# Patient Record
Sex: Female | Born: 1985
Health system: Southern US, Community
[De-identification: ages and names within clinical notes are randomized; demographics above are authoritative.]

## PROBLEM LIST (undated history)

## (undated) ENCOUNTER — Inpatient Hospital Stay (HOSPITAL_COMMUNITY): Payer: Self-pay

## (undated) DIAGNOSIS — Z302 Encounter for sterilization: Secondary | ICD-10-CM

## (undated) DIAGNOSIS — L309 Dermatitis, unspecified: Secondary | ICD-10-CM

## (undated) DIAGNOSIS — Z8659 Personal history of other mental and behavioral disorders: Secondary | ICD-10-CM

## (undated) DIAGNOSIS — Z8679 Personal history of other diseases of the circulatory system: Secondary | ICD-10-CM

## (undated) DIAGNOSIS — A6000 Herpesviral infection of urogenital system, unspecified: Secondary | ICD-10-CM

## (undated) DIAGNOSIS — F32A Depression, unspecified: Secondary | ICD-10-CM

## (undated) DIAGNOSIS — F329 Major depressive disorder, single episode, unspecified: Secondary | ICD-10-CM

## (undated) DIAGNOSIS — F319 Bipolar disorder, unspecified: Secondary | ICD-10-CM

## (undated) DIAGNOSIS — Z8619 Personal history of other infectious and parasitic diseases: Secondary | ICD-10-CM

## (undated) DIAGNOSIS — F419 Anxiety disorder, unspecified: Secondary | ICD-10-CM

## (undated) DIAGNOSIS — D649 Anemia, unspecified: Secondary | ICD-10-CM

## (undated) HISTORY — PX: MULTIPLE TOOTH EXTRACTIONS: SHX2053

## (undated) HISTORY — PX: WISDOM TOOTH EXTRACTION: SHX21

## (undated) HISTORY — PX: TOOTH EXTRACTION: SUR596

## (undated) HISTORY — PX: INDUCED ABORTION: SHX677

---
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
## 2006-02-21 ENCOUNTER — Emergency Department (HOSPITAL_COMMUNITY): Admission: EM | Admit: 2006-02-21 | Discharge: 2006-02-21 | Payer: Self-pay | Admitting: Emergency Medicine

## 2006-04-24 ENCOUNTER — Emergency Department (HOSPITAL_COMMUNITY): Admission: EM | Admit: 2006-04-24 | Discharge: 2006-04-24 | Payer: Self-pay | Admitting: Emergency Medicine

## 2008-01-03 HISTORY — PX: OTHER SURGICAL HISTORY: SHX169

## 2008-01-05 ENCOUNTER — Emergency Department (HOSPITAL_COMMUNITY): Admission: EM | Admit: 2008-01-05 | Discharge: 2008-01-06 | Payer: Self-pay | Admitting: Emergency Medicine

## 2008-01-07 ENCOUNTER — Emergency Department (HOSPITAL_COMMUNITY): Admission: EM | Admit: 2008-01-07 | Discharge: 2008-01-07 | Payer: Self-pay | Admitting: Emergency Medicine

## 2008-01-11 ENCOUNTER — Emergency Department (HOSPITAL_COMMUNITY): Admission: EM | Admit: 2008-01-11 | Discharge: 2008-01-11 | Payer: Self-pay | Admitting: *Deleted

## 2008-03-05 ENCOUNTER — Emergency Department (HOSPITAL_COMMUNITY): Admission: EM | Admit: 2008-03-05 | Discharge: 2008-03-05 | Payer: Self-pay | Admitting: Emergency Medicine

## 2008-04-17 ENCOUNTER — Emergency Department (HOSPITAL_COMMUNITY): Admission: EM | Admit: 2008-04-17 | Discharge: 2008-04-17 | Payer: Self-pay | Admitting: Emergency Medicine

## 2008-08-13 ENCOUNTER — Ambulatory Visit (HOSPITAL_COMMUNITY): Admission: RE | Admit: 2008-08-13 | Discharge: 2008-08-13 | Payer: Self-pay | Admitting: Obstetrics and Gynecology

## 2008-08-24 ENCOUNTER — Inpatient Hospital Stay (HOSPITAL_COMMUNITY): Admission: AD | Admit: 2008-08-24 | Discharge: 2008-08-24 | Payer: Self-pay | Admitting: Obstetrics and Gynecology

## 2008-09-02 ENCOUNTER — Inpatient Hospital Stay (HOSPITAL_COMMUNITY): Admission: AD | Admit: 2008-09-02 | Discharge: 2008-09-06 | Payer: Self-pay | Admitting: Obstetrics and Gynecology

## 2008-09-03 ENCOUNTER — Encounter (INDEPENDENT_AMBULATORY_CARE_PROVIDER_SITE_OTHER): Payer: Self-pay | Admitting: Obstetrics and Gynecology

## 2008-09-30 ENCOUNTER — Inpatient Hospital Stay (HOSPITAL_COMMUNITY): Admission: AD | Admit: 2008-09-30 | Discharge: 2008-09-30 | Payer: Self-pay | Admitting: Obstetrics and Gynecology

## 2009-05-31 ENCOUNTER — Ambulatory Visit: Payer: Self-pay | Admitting: Advanced Practice Midwife

## 2009-05-31 ENCOUNTER — Inpatient Hospital Stay (HOSPITAL_COMMUNITY): Admission: AD | Admit: 2009-05-31 | Discharge: 2009-05-31 | Payer: Self-pay | Admitting: Obstetrics and Gynecology

## 2009-12-16 ENCOUNTER — Emergency Department (HOSPITAL_COMMUNITY)
Admission: EM | Admit: 2009-12-16 | Discharge: 2009-12-16 | Payer: Self-pay | Source: Home / Self Care | Admitting: Emergency Medicine

## 2010-02-27 ENCOUNTER — Emergency Department (HOSPITAL_COMMUNITY)
Admission: EM | Admit: 2010-02-27 | Discharge: 2010-02-27 | Disposition: A | Discharge status: Home / Self Care | Payer: Medicaid Other | Attending: Emergency Medicine | Admitting: Emergency Medicine

## 2010-02-27 ENCOUNTER — Emergency Department (HOSPITAL_COMMUNITY)
Admission: EM | Admit: 2010-02-27 | Discharge: 2010-02-27 | Discharge status: Against Medical Advice | Payer: Medicaid Other | Attending: Emergency Medicine | Admitting: Emergency Medicine

## 2010-02-27 DIAGNOSIS — M545 Low back pain, unspecified: Secondary | ICD-10-CM | POA: Insufficient documentation

## 2010-02-27 DIAGNOSIS — M549 Dorsalgia, unspecified: Secondary | ICD-10-CM | POA: Insufficient documentation

## 2010-02-27 DIAGNOSIS — M79609 Pain in unspecified limb: Secondary | ICD-10-CM | POA: Insufficient documentation

## 2010-02-27 DIAGNOSIS — R209 Unspecified disturbances of skin sensation: Secondary | ICD-10-CM | POA: Insufficient documentation

## 2010-02-27 DIAGNOSIS — M5430 Sciatica, unspecified side: Secondary | ICD-10-CM | POA: Insufficient documentation

## 2010-02-27 LAB — URINALYSIS, ROUTINE W REFLEX MICROSCOPIC
Bilirubin Urine: NEGATIVE
Nitrite: NEGATIVE
Protein, ur: NEGATIVE mg/dL
Specific Gravity, Urine: 1.019 (ref 1.005–1.030)
Urine Glucose, Fasting: NEGATIVE mg/dL
Urobilinogen, UA: 1 mg/dL (ref 0.0–1.0)

## 2010-02-27 LAB — POCT PREGNANCY, URINE: Preg Test, Ur: NEGATIVE

## 2010-03-21 LAB — URINALYSIS, ROUTINE W REFLEX MICROSCOPIC
Bilirubin Urine: NEGATIVE
Glucose, UA: NEGATIVE mg/dL
Ketones, ur: NEGATIVE mg/dL
Nitrite: NEGATIVE
Protein, ur: NEGATIVE mg/dL
Specific Gravity, Urine: 1.02 (ref 1.005–1.030)

## 2010-03-21 LAB — POCT PREGNANCY, URINE: Preg Test, Ur: NEGATIVE

## 2010-03-21 LAB — CBC
Hemoglobin: 11.7 g/dL — ABNORMAL LOW (ref 12.0–15.0)
MCHC: 33.1 g/dL (ref 30.0–36.0)
MCV: 82.2 fL (ref 78.0–100.0)
Platelets: 315 10*3/uL (ref 150–400)
RBC: 4.3 MIL/uL (ref 3.87–5.11)
RDW: 18 % — ABNORMAL HIGH (ref 11.5–15.5)
WBC: 6.3 10*3/uL (ref 4.0–10.5)

## 2010-03-21 LAB — COMPREHENSIVE METABOLIC PANEL
ALT: 13 U/L (ref 0–35)
GFR calc Af Amer: >60 mL/min (ref >60)
Glucose, Bld: 86 mg/dL (ref 70–99)
Sodium: 136 mEq/L (ref 135–145)
Total Protein: 8.2 g/dL (ref 6.0–8.3)

## 2010-03-21 LAB — GC/CHLAMYDIA PROBE AMP, GENITAL: Chlamydia, DNA Probe: NEGATIVE

## 2010-03-21 LAB — URINE MICROSCOPIC-ADD ON

## 2010-04-07 ENCOUNTER — Inpatient Hospital Stay (INDEPENDENT_AMBULATORY_CARE_PROVIDER_SITE_OTHER)
Admission: RE | Admit: 2010-04-07 | Discharge: 2010-04-07 | Disposition: A | Discharge status: Home / Self Care | Payer: Medicaid Other | Source: Ambulatory Visit | Attending: Family Medicine | Admitting: Family Medicine

## 2010-04-07 DIAGNOSIS — L738 Other specified follicular disorders: Secondary | ICD-10-CM

## 2010-04-08 LAB — CBC
HCT: 23.7 % — ABNORMAL LOW (ref 36.0–46.0)
Hemoglobin: 8.1 g/dL — ABNORMAL LOW (ref 12.0–15.0)
MCHC: 32.6 g/dL (ref 30.0–36.0)
MCHC: 33.2 g/dL (ref 30.0–36.0)
MCV: 87.6 fL (ref 78.0–100.0)
Platelets: 401 10*3/uL — ABNORMAL HIGH (ref 150–400)
Platelets: 242 10*3/uL (ref 150–400)
RBC: 3.65 MIL/uL — ABNORMAL LOW (ref 3.87–5.11)
WBC: 15.7 10*3/uL — ABNORMAL HIGH (ref 4.0–10.5)

## 2010-04-08 LAB — COMPREHENSIVE METABOLIC PANEL
ALT: 16 U/L (ref 0–35)
Alkaline Phosphatase: 69 U/L (ref 39–117)
BUN: 10 mg/dL (ref 6–23)
CO2: 26 mEq/L (ref 19–32)
Calcium: 9.3 mg/dL (ref 8.4–10.5)
Creatinine, Ser: 0.7 mg/dL (ref 0.4–1.2)
GFR calc Af Amer: >60 mL/min (ref >60)
Glucose, Bld: 87 mg/dL (ref 70–99)
Potassium: 3.6 mEq/L (ref 3.5–5.1)
Sodium: 140 mEq/L (ref 135–145)

## 2010-04-13 LAB — URINE MICROSCOPIC-ADD ON

## 2010-04-13 LAB — URINE CULTURE: Colony Count: >=100000

## 2010-04-13 LAB — URINALYSIS, ROUTINE W REFLEX MICROSCOPIC
Bilirubin Urine: NEGATIVE
Hgb urine dipstick: NEGATIVE
Ketones, ur: NEGATIVE mg/dL
Nitrite: NEGATIVE
Protein, ur: NEGATIVE mg/dL

## 2010-04-13 LAB — GC/CHLAMYDIA PROBE AMP, GENITAL: Chlamydia, DNA Probe: NEGATIVE

## 2010-04-13 LAB — WET PREP, GENITAL

## 2010-04-18 LAB — URINALYSIS, ROUTINE W REFLEX MICROSCOPIC
Hgb urine dipstick: NEGATIVE
Ketones, ur: 15 mg/dL — AB
Nitrite: NEGATIVE
Protein, ur: NEGATIVE mg/dL
Urobilinogen, UA: 1 mg/dL (ref 0.0–1.0)

## 2010-04-18 LAB — DIFFERENTIAL
Basophils Relative: 0 % (ref 0–1)
Lymphocytes Relative: 21 % (ref 12–46)
Lymphs Abs: 1.7 10*3/uL (ref 0.7–4.0)
Monocytes Absolute: 0.6 10*3/uL (ref 0.1–1.0)
Monocytes Relative: 7 % (ref 3–12)
Neutro Abs: 6 10*3/uL (ref 1.7–7.7)
Neutrophils Relative %: 71 % (ref 43–77)

## 2010-04-18 LAB — WET PREP, GENITAL
Trich, Wet Prep: NONE SEEN
Yeast Wet Prep HPF POC: NONE SEEN

## 2010-04-18 LAB — PREGNANCY, URINE: Preg Test, Ur: POSITIVE

## 2010-04-18 LAB — CBC
Hemoglobin: 11.8 g/dL — ABNORMAL LOW (ref 12.0–15.0)
RBC: 4.2 MIL/uL (ref 3.87–5.11)
WBC: 8.4 10*3/uL (ref 4.0–10.5)

## 2010-04-18 LAB — POCT I-STAT, CHEM 8
BUN: 12 mg/dL (ref 6–23)
Calcium, Ion: 1.11 mmol/L — ABNORMAL LOW (ref 1.12–1.32)
Chloride: 104 mEq/L (ref 96–112)
Glucose, Bld: 94 mg/dL (ref 70–99)
HCT: 38 % (ref 36.0–46.0)
Potassium: 4.1 mEq/L (ref 3.5–5.1)

## 2010-04-18 LAB — URINE MICROSCOPIC-ADD ON

## 2010-04-18 LAB — ABO/RH: ABO/RH(D): O POS

## 2010-04-18 LAB — POCT PREGNANCY, URINE: Preg Test, Ur: POSITIVE

## 2010-05-15 ENCOUNTER — Emergency Department (HOSPITAL_COMMUNITY)
Admission: EM | Admit: 2010-05-15 | Discharge: 2010-05-15 | Disposition: A | Discharge status: Home / Self Care | Payer: Medicaid Other | Attending: Emergency Medicine | Admitting: Emergency Medicine

## 2010-05-15 DIAGNOSIS — T781XXA Other adverse food reactions, not elsewhere classified, initial encounter: Secondary | ICD-10-CM | POA: Insufficient documentation

## 2010-05-15 DIAGNOSIS — L272 Dermatitis due to ingested food: Secondary | ICD-10-CM | POA: Insufficient documentation

## 2010-05-15 DIAGNOSIS — L299 Pruritus, unspecified: Secondary | ICD-10-CM | POA: Insufficient documentation

## 2010-05-17 NOTE — Op Note (Signed)
Alison Gomez, Alison Gomez              ACCOUNT NO.:  [id number]   MEDICAL RECORD NO.:  [id number]          PATIENT TYPE:  INP   LOCATION:  9132                          FACILITY:  [facility]   PHYSICIAN:  Sherron Monday, MD        DATE OF BIRTH:  [birth year]   DATE OF PROCEDURE:  09/03/2008  DATE OF DISCHARGE:                               OPERATIVE REPORT   PREOPERATIVE DIAGNOSES:  Intrauterine pregnancy at term, failed vacuum,  arrest of descent, severe variables.   POSTOPERATIVE DIAGNOSIS:  Intrauterine pregnancy at term, failed vacuum,  arrest of descent, severe variables, delivered.   PROCEDURE:  Primary low-transverse cesarean section.   SURGEON:  Sherron Monday, MD   ASSISTANT:  None.   ANESTHESIA:  Epidural as well as attempted spinal placement.   FINDINGS:  A viable female infant at 12:55 a.m. with Apgars of 5 at 1  minute and 5 minutes and a weight of 10 pounds 7 ounces and a cord pH of  7.34.  Placenta was expressed intact.  Normal uterus, tubes, and ovaries  were noted.   PATHOLOGY:  Placenta.   COMPLICATIONS:  None.   ESTIMATED BLOOD LOSS:  1000 mL.   URINE OUTPUT:  125 mL clear urine at the end of the procedure.   IV FLUIDS:  3000 mL.   DISPOSITION:  Stable to PACU following the procedure.   DESCRIPTION OF PROCEDURE:  After informed consent was reviewed with the  patient including risks, benefits, and alternatives of the procedure  given attempt at vacuum extraction with minimal descent and 2 pop offs.  The patient opted to have a low-transverse cesarean section.  The infant  was at +2 station.  She was transferred to the operating room where her  level of her dose epidural was tested and found to be low.  A spinal was  attempted to be placed unsuccessfully, but again the epidural was tested  and found to be adequate.  She was then prepped and draped in the normal  sterile fashion.  A Foley catheter had previously been placed.  Pfannenstiel incision was made at the level  approximately 2  fingerbreadths above the pubic symphysis, carried through to the  underlying layer of fascia sharply.  The fascia was incised in midline.  The incision was extended laterally with Mayo scissors.  Inferior aspect  of fascial incision was grasped with Kocher clamps, elevating the rectus  muscles were dissected off both bluntly and sharply.  Attention was then  turned to the superior portion of the incision which in a similar  fashion was grasped with Kocher clamps, elevating the rectus muscles  were dissected off both bluntly and sharply.  Peritoneum was entered  bluntly.  Incision was extended superiorly and inferiorly with good  visualization of the bladder.  An Alexis skin retractor was carefully  placed and checked to make sure no bowel was entrapped.  The uterus was  inspected.  The vesicouterine peritoneum was identified, tented up with  smooth pickups, and a bladder flap was created both digitally and  sharply.  The uterus was incised in a  transverse fashion with a push  from below.  The infant was delivered with some difficulty from the  vertex presentation and the cord was clamped and cut.  Cord pH was also  sent.  Nose and mouth were suctioned.  The infant was handed off to  awaiting pediatric staff.  At the time of delivery, thick meconium was  noted.  Uterus was cleared of all clots and debris and the placenta was  expressed.  The uterine incision was closed with 2 layers the first of  which was 0 Vicryl and the second as an imbricating layer of 0 Monocryl.  The incision was found to be hemostatic.  Copious pelvic irrigation was  performed.  Subfascial planes were inspected.  The peritoneum was closed  using 2-0 Vicryl.  The fascia was closed after inspection, assured  hemostasis with 0 Vicryl in a running fashion.  Subcuticular adipose  layer was irrigated, made hemostatic with Bovie cautery.  The skin was  closed with staples.  The patient tolerated the  procedure well.  Sponge,  lap, and needle counts were correct x2 at the end of procedure.      Sherron Monday, MD  Electronically Signed     JB/MEDQ  D:  09/03/2008  T:  09/03/2008  Job:  [id number]

## 2010-05-22 ENCOUNTER — Emergency Department (HOSPITAL_COMMUNITY)
Admission: EM | Admit: 2010-05-22 | Discharge: 2010-05-23 | Disposition: A | Discharge status: Home / Self Care | Payer: Medicaid Other | Attending: Emergency Medicine | Admitting: Emergency Medicine

## 2010-05-22 DIAGNOSIS — F411 Generalized anxiety disorder: Secondary | ICD-10-CM | POA: Insufficient documentation

## 2010-05-22 DIAGNOSIS — T148XXA Other injury of unspecified body region, initial encounter: Secondary | ICD-10-CM | POA: Insufficient documentation

## 2010-05-22 DIAGNOSIS — Z3201 Encounter for pregnancy test, result positive: Secondary | ICD-10-CM | POA: Insufficient documentation

## 2010-05-22 DIAGNOSIS — X58XXXA Exposure to other specified factors, initial encounter: Secondary | ICD-10-CM | POA: Insufficient documentation

## 2010-05-22 DIAGNOSIS — M549 Dorsalgia, unspecified: Secondary | ICD-10-CM | POA: Insufficient documentation

## 2010-06-08 ENCOUNTER — Ambulatory Visit (HOSPITAL_COMMUNITY)
Admission: RE | Admit: 2010-06-08 | Discharge: 2010-06-08 | Disposition: A | Discharge status: Home / Self Care | Payer: Medicaid Other | Source: Ambulatory Visit | Attending: Obstetrics and Gynecology | Admitting: Obstetrics and Gynecology

## 2010-06-08 ENCOUNTER — Other Ambulatory Visit (HOSPITAL_COMMUNITY): Payer: Self-pay | Admitting: Obstetrics and Gynecology

## 2010-06-08 DIAGNOSIS — O3680X0 Pregnancy with inconclusive fetal viability, not applicable or unspecified: Secondary | ICD-10-CM

## 2010-06-08 DIAGNOSIS — Z3689 Encounter for other specified antenatal screening: Secondary | ICD-10-CM | POA: Insufficient documentation

## 2010-06-14 ENCOUNTER — Inpatient Hospital Stay (HOSPITAL_COMMUNITY)
Admission: AD | Admit: 2010-06-14 | Discharge: 2010-06-14 | Disposition: A | Discharge status: Home / Self Care | Payer: Medicaid Other | Source: Ambulatory Visit | Attending: Obstetrics and Gynecology | Admitting: Obstetrics and Gynecology

## 2010-06-14 DIAGNOSIS — O99891 Other specified diseases and conditions complicating pregnancy: Secondary | ICD-10-CM | POA: Insufficient documentation

## 2010-06-14 DIAGNOSIS — O210 Mild hyperemesis gravidarum: Secondary | ICD-10-CM

## 2010-06-14 DIAGNOSIS — R51 Headache: Secondary | ICD-10-CM | POA: Insufficient documentation

## 2010-06-14 DIAGNOSIS — O9989 Other specified diseases and conditions complicating pregnancy, childbirth and the puerperium: Secondary | ICD-10-CM

## 2010-06-14 DIAGNOSIS — R42 Dizziness and giddiness: Secondary | ICD-10-CM

## 2010-06-14 LAB — URINALYSIS, ROUTINE W REFLEX MICROSCOPIC
Bilirubin Urine: NEGATIVE
Glucose, UA: NEGATIVE mg/dL
Hgb urine dipstick: NEGATIVE
Protein, ur: NEGATIVE mg/dL
Specific Gravity, Urine: 1.02 (ref 1.005–1.030)

## 2010-06-30 LAB — ABO/RH: RH Type: POSITIVE

## 2010-06-30 LAB — GC/CHLAMYDIA PROBE AMP, GENITAL
Chlamydia: NEGATIVE
Gonorrhea: NEGATIVE

## 2010-06-30 LAB — RUBELLA ANTIBODY, IGM: Rubella: IMMUNE

## 2010-08-11 ENCOUNTER — Inpatient Hospital Stay (HOSPITAL_COMMUNITY)
Admission: AD | Admit: 2010-08-11 | Discharge: 2010-08-11 | Disposition: A | Discharge status: Home / Self Care | Payer: Medicaid Other | Source: Ambulatory Visit | Attending: Obstetrics and Gynecology | Admitting: Obstetrics and Gynecology

## 2010-08-11 ENCOUNTER — Encounter (HOSPITAL_COMMUNITY): Payer: Self-pay | Admitting: *Deleted

## 2010-08-11 DIAGNOSIS — R1011 Right upper quadrant pain: Secondary | ICD-10-CM | POA: Insufficient documentation

## 2010-08-11 DIAGNOSIS — R109 Unspecified abdominal pain: Secondary | ICD-10-CM

## 2010-08-11 DIAGNOSIS — O99891 Other specified diseases and conditions complicating pregnancy: Secondary | ICD-10-CM | POA: Insufficient documentation

## 2010-08-11 DIAGNOSIS — O26899 Other specified pregnancy related conditions, unspecified trimester: Secondary | ICD-10-CM

## 2010-08-11 LAB — CBC
HCT: 33.3 % — ABNORMAL LOW (ref 36.0–46.0)
Hemoglobin: 10.9 g/dL — ABNORMAL LOW (ref 12.0–15.0)
WBC: 9 10*3/uL (ref 4.0–10.5)

## 2010-08-11 LAB — DIFFERENTIAL
Lymphocytes Relative: 24 % (ref 12–46)
Lymphs Abs: 2.1 10*3/uL (ref 0.7–4.0)
Monocytes Absolute: 0.7 10*3/uL (ref 0.1–1.0)
Monocytes Relative: 8 % (ref 3–12)
Neutro Abs: 6.1 10*3/uL (ref 1.7–7.7)

## 2010-08-11 LAB — URINALYSIS, ROUTINE W REFLEX MICROSCOPIC
Glucose, UA: NEGATIVE mg/dL
Hgb urine dipstick: NEGATIVE
Leukocytes, UA: NEGATIVE
pH: 7 (ref 5.0–8.0)

## 2010-08-11 MED ORDER — HYDROXYZINE HCL 50 MG/ML IM SOLN
50.0000 mg | Freq: Once | INTRAMUSCULAR | Status: AC
  Administered 2010-08-11: 50 mg via INTRAMUSCULAR
  Filled 2010-08-11: qty 1

## 2010-08-11 MED ORDER — HYDROXYZINE PAMOATE 50 MG PO CAPS: 50.0000 mg | ORAL_CAPSULE | Freq: Three times a day (TID) | ORAL | Status: AC | PRN

## 2010-08-11 NOTE — Progress Notes (Signed)
States was at work, only had sips of water and pudding intake today.  C/o abd pain that begins in RUQ, radiates to left side, chest feels as if tightening, crying, hyperventilates with pain.  Feels better to lie flat.  Was seen twice before for similar episodes.  First time was told it was a panic attack, second time was to get an x-ray but found out then she was pregnant.

## 2010-08-11 NOTE — ED Provider Notes (Addendum)
History   Pt presents today via EMS c/o Rt upper quad pain that comes and goes and is worse with certain movements. She denies vag dc, bleeding, N&V, fever, or any other sx at this time.  Chief Complaint  Patient presents with  . Abdominal Pain    RUQ radiates to L side of umb.   HPI  OB History    Grav Para Term Preterm Abortions TAB SAB Ect Mult Living   3 1 1  1 1    1       Past Medical History  Diagnosis Date  . Panic attack   . Herpes genitalia     Past Surgical History  Procedure Date  . No past surgeries     No family history on file.  History  Substance Use Topics  . Smoking status: Never Smoker   . Smokeless tobacco: Never Used  . Alcohol Use: No    Allergies: No Known Allergies  Prescriptions prior to admission  Medication Sig Dispense Refill  . GuaiFENesin (TUSSIN PO) Take 5 mLs by mouth daily as needed.        . prenatal vitamin w/FE, FA (PRENATAL 1 + 1) 27-1 MG TABS Take 1 tablet by mouth daily.          Review of Systems  Constitutional: Negative for fever.  Gastrointestinal: Positive for abdominal pain. Negative for nausea, vomiting, diarrhea and constipation.  Genitourinary: Negative for dysuria, urgency, frequency and hematuria.  Neurological: Negative for dizziness and headaches.  Psychiatric/Behavioral: Negative for depression and suicidal ideas.   Physical Exam   Blood pressure 118/81, pulse 68, temperature 97.5 F (36.4 C), temperature source Oral, resp. rate 20, SpO2 99.00%.  Physical Exam  Constitutional: She is oriented to person, place, and time. She appears well-developed and well-nourished. No distress.  HENT:  Head: Normocephalic and atraumatic.  Eyes: EOM are normal. Pupils are equal, round, and reactive to light.  Cardiovascular: Normal rate, regular rhythm and normal heart sounds.  Exam reveals no gallop and no friction rub.   No murmur heard. Respiratory: Effort normal and breath sounds normal. No respiratory distress. She  has no wheezes. She has no rales. She exhibits no tenderness.  GI: Soft. She exhibits no distension. There is tenderness. There is no rebound and no guarding.  Genitourinary: No tenderness or bleeding around the vagina. No vaginal discharge found.       Cervix Lg/closed. Uterus is nontender to palpation.  Neurological: She is alert and oriented to person, place, and time.  Skin: Skin is warm and dry. She is not diaphoretic.  Psychiatric: She has a normal mood and affect. Her behavior is normal. Judgment and thought content normal.    MAU Course  Procedures  Discussed pt with Dr. Senaida Ores at length.  Results for orders placed during the hospital encounter of 08/11/10 (from the past 24 hour(s))  URINALYSIS, ROUTINE W REFLEX MICROSCOPIC     Status: Abnormal   Collection Time   08/11/10  4:40 PM      Component Value Range   Color, Urine YELLOW  YELLOW    Appearance HAZY (*) CLEAR    Specific Gravity, Urine 1.020  1.005 - 1.030    pH 7.0  5.0 - 8.0    Glucose, UA NEGATIVE  NEGATIVE (mg/dL)   Hgb urine dipstick NEGATIVE  NEGATIVE    Bilirubin Urine NEGATIVE  NEGATIVE    Ketones, ur 40 (*) NEGATIVE (mg/dL)   Protein, ur NEGATIVE  NEGATIVE (mg/dL)  Urobilinogen, UA 0.2  0.0 - 1.0 (mg/dL)   Nitrite NEGATIVE  NEGATIVE    Leukocytes, UA NEGATIVE  NEGATIVE   CBC     Status: Abnormal   Collection Time   08/11/10  4:46 PM      Component Value Range   WBC 9.0  4.0 - 10.5 (K/uL)   RBC 3.87  3.87 - 5.11 (MIL/uL)   Hemoglobin 10.9 (*) 12.0 - 15.0 (g/dL)   HCT 78.4 (*) 69.6 - 46.0 (%)   MCV 86.0  78.0 - 100.0 (fL)   MCH 28.2  26.0 - 34.0 (pg)   MCHC 32.7  30.0 - 36.0 (g/dL)   RDW 29.5 (*) 28.4 - 15.5 (%)   Platelets 317  150 - 400 (K/uL)  DIFFERENTIAL     Status: Normal   Collection Time   08/11/10  4:46 PM      Component Value Range   Neutrophils Relative 68  43 - 77 (%)   Neutro Abs 6.1  1.7 - 7.7 (K/uL)   Lymphocytes Relative 24  12 - 46 (%)   Lymphs Abs 2.1  0.7 - 4.0 (K/uL)    Monocytes Relative 8  3 - 12 (%)   Monocytes Absolute 0.7  0.1 - 1.0 (K/uL)   Eosinophils Relative 1  0 - 5 (%)   Eosinophils Absolute 0.1  0.0 - 0.7 (K/uL)   Basophils Relative 0  0 - 1 (%)   Basophils Absolute 0.0  0.0 - 0.1 (K/uL)     Assessment and Plan  Pt most likely experiencing muscle strain and anxiety. She has f/u scheduled. Discussed diet, activity, risks, and precautions.  Clinton Gallant. Macayla Ekdahl III, DrHSc, MPAS, PA-C  08/11/2010, 4:22 PM   Henrietta Hoover, PA 08/11/10 1754

## 2010-12-23 ENCOUNTER — Encounter (HOSPITAL_COMMUNITY): Payer: Self-pay | Admitting: Pharmacist

## 2011-01-05 ENCOUNTER — Encounter (HOSPITAL_COMMUNITY): Payer: Self-pay | Admitting: *Deleted

## 2011-01-06 ENCOUNTER — Inpatient Hospital Stay (HOSPITAL_COMMUNITY)
Admission: AD | Admit: 2011-01-06 | Discharge: 2011-01-06 | Disposition: A | Discharge status: Home / Self Care | Payer: Medicaid Other | Source: Ambulatory Visit | Attending: Obstetrics and Gynecology | Admitting: Obstetrics and Gynecology

## 2011-01-06 ENCOUNTER — Encounter (HOSPITAL_COMMUNITY): Payer: Self-pay

## 2011-01-06 ENCOUNTER — Encounter (HOSPITAL_COMMUNITY): Payer: Self-pay | Admitting: *Deleted

## 2011-01-06 ENCOUNTER — Encounter (HOSPITAL_COMMUNITY)
Admission: RE | Admit: 2011-01-06 | Discharge: 2011-01-06 | Disposition: A | Discharge status: Home / Self Care | Payer: Medicaid Other | Source: Ambulatory Visit | Attending: Obstetrics and Gynecology | Admitting: Obstetrics and Gynecology

## 2011-01-06 DIAGNOSIS — O99891 Other specified diseases and conditions complicating pregnancy: Secondary | ICD-10-CM | POA: Insufficient documentation

## 2011-01-06 DIAGNOSIS — A084 Viral intestinal infection, unspecified: Secondary | ICD-10-CM

## 2011-01-06 DIAGNOSIS — R109 Unspecified abdominal pain: Secondary | ICD-10-CM | POA: Insufficient documentation

## 2011-01-06 DIAGNOSIS — A088 Other specified intestinal infections: Secondary | ICD-10-CM

## 2011-01-06 DIAGNOSIS — O212 Late vomiting of pregnancy: Secondary | ICD-10-CM | POA: Insufficient documentation

## 2011-01-06 LAB — RPR: RPR Ser Ql: NONREACTIVE

## 2011-01-06 LAB — CBC
HCT: 30 % — ABNORMAL LOW (ref 36.0–46.0)
MCH: 25.7 pg — ABNORMAL LOW (ref 26.0–34.0)
MCV: 81.3 fL (ref 78.0–100.0)
Platelets: 303 10*3/uL (ref 150–400)
RBC: 3.69 MIL/uL — ABNORMAL LOW (ref 3.87–5.11)

## 2011-01-06 NOTE — Progress Notes (Signed)
Started last night, got really bad last night, lots of pressure, feels like it is opening up .

## 2011-01-06 NOTE — Patient Instructions (Addendum)
YOUR PROCEDURE IS SCHEDULED ON:01/10/11  ENTER THROUGH THE MAIN ENTRANCE OF [facility] AT:0745 am  USE DESK PHONE AND DIAL [phone] TO INFORM us OF YOUR ARRIVAL  CALL [phone] IF YOU HAVE ANY QUESTIONS OR PROBLEMS PRIOR TO YOUR ARRIVAL.  REMEMBER: DO NOT EAT OR DRINK AFTER MIDNIGHT :Monday  SPECIAL INSTRUCTIONS:   YOU MAY BRUSH YOUR TEETH THE MORNING OF SURGERY   TAKE THESE MEDICINES THE DAY OF SURGERY WITH SIP OF WATER:none   DO NOT WEAR JEWELRY, EYE MAKEUP, LIPSTICK OR DARK FINGERNAIL POLISH DO NOT WEAR LOTIONS  DO NOT SHAVE FOR 48 HOURS PRIOR TO SURGERY  YOU WILL NOT BE ALLOWED TO DRIVE YOURSELF HOME.  NAME OF DRIVER:Earl- [phone]

## 2011-01-06 NOTE — Progress Notes (Signed)
Onset of diarrhea and vomiting since last night, abdominal pain, patient was in preop for scheduled C/S on Tuesday sent over to MAU for evaluation 39 weeks, G2P1

## 2011-01-06 NOTE — ED Notes (Signed)
Retained p.o. Fluids and crackers, CNM notified

## 2011-01-06 NOTE — ED Provider Notes (Signed)
Alison Gomez y.o.G3P1011 @[redacted]w[redacted]d  Chief Complaint  Patient presents with  . Abdominal Pain  . Emesis  . Diarrhea    SUBJECTIVE  HPI: Sent to MAU from preop visit ([facility]CS next week)  due to complaints of lower abdominal pain with nausea and vomiting x 1 day. The abdominal pain is episodic and similar to that she's been having with fetal movement but her pelvic pressure is increased. She retained peaches she ate this morning. She's now hungry. She denies dysuria, urgency, frequency. No leakage of fluid or vaginal bleeding.  Past Medical History  Diagnosis Date  . Herpes genitalia   . Panic attack   . Trichomonas    Past Surgical History  Procedure Date  . Cesarean section   . Induced abortion    History   Social History  . Marital Status: Single    Spouse Name: N/A    Number of Children: N/A  . Years of Education: N/A   Occupational History  . Not on file.   Social History Main Topics  . Smoking status: Never Smoker   . Smokeless tobacco: Never Used  . Alcohol Use: Yes     not during preg  . Drug Use: No  . Sexually Active: Yes   Other Topics Concern  . Not on file   Social History Narrative  . No narrative on file   No current facility-administered medications on file prior to encounter.   Current Outpatient Prescriptions on File Prior to Encounter  Medication Sig Dispense Refill  . prenatal vitamin w/FE, FA (PRENATAL 1 + 1) 27-1 MG TABS Take 1 tablet by mouth daily.        Marland Kitchen zolpidem (AMBIEN) 10 MG tablet Take 10 mg by mouth at bedtime as needed. For sleep        No Known Allergies  ROS: Pertinent items in HPI  OBJECTIVE  BP 116/81  Pulse 63  Temp(Src) 98 F (36.7 C) (Oral)  Resp 16  Ht 5\' 10"  (1.778 m)  Wt 254 lb (115.214 kg)  BMI 36.45 kg/m2  LMP 06/30/2010  Physical Exam  Constitutional: She is oriented to person, place, and time and well-developed, well-nourished, and in no distress.  HENT:  Head: Normocephalic.  Neck: Neck supple.    Cardiovascular: Normal rate.   Pulmonary/Chest: Effort normal.  Abdominal: Soft. There is no tenderness.  Genitourinary:       Vaginal exam: Cervix long closed, vertex -3  Musculoskeletal: Normal range of motion.  Neurological: She is alert and oriented to person, place, and time.  Skin: Skin is warm and dry.  Psychiatric: Affect normal.   MAU Course: Toco: without UCs FHR: 130, reactive Retaining juice and crackers. Did not give urine sample so will cancel it  ASSESSMENT  Mild VGE or GI upset Category 1 FHR    PLAN Reassured. BRAT diet. Will send her back to her preop appointment.

## 2011-01-08 NOTE — Progress Notes (Signed)
FHT from 1-4 reviewed.  Reactive NST, no regular ctx.

## 2011-01-09 ENCOUNTER — Other Ambulatory Visit: Payer: Self-pay | Admitting: Obstetrics and Gynecology

## 2011-01-09 NOTE — H&P (Signed)
Alison Gomez is an 26 y.o. female G21P1011 @ 39+ for rLTCS.  +FM, no LOF, no VB, occ ctx; Uncomplicated Prenatal care, has h/o 10#+ baby.  D/W pt R/B/A of rLTCS, including bleeding/infection/damage to surrounding organs/ also trouble healing, pt voices understanding,    OB/Gyn History: G3P1011 G1 TAB, G2 LTCS, 10#7, G3 present No abnormal pap; h/o trich    Past Medical History  Diagnosis Date  . Herpes genitalia   . Panic attack   . Trichomonas   . No pertinent past medical history     Past Surgical History  Procedure Date  . Cesarean section   . Induced abortion     Family History  Problem Relation Age of Onset  . Anesthesia problems Neg Hx   pt denies  Social History:  reports that she has never smoked. She has never used smokeless tobacco. She reports that she drinks alcohol. She reports that she does not use illicit drugs.  Allergies: No Known Allergies, chocolate Meds PNV  Review of Systems  Constitutional: Negative.   HENT: Negative.   Eyes: Negative.   Respiratory: Negative.   Cardiovascular: Negative.   Gastrointestinal: Negative.   Genitourinary: Negative.   Musculoskeletal: Negative.   Skin: Negative.   Neurological: Negative.   Psychiatric/Behavioral: Negative.     Last menstrual period 06/30/2010. Physical Exam  Constitutional: She is oriented to person, place, and time. She appears well-developed and well-nourished.  HENT:  Head: Normocephalic and atraumatic.  Neck: Normal range of motion. Neck supple. No thyromegaly present.  Cardiovascular: Normal rate and regular rhythm.   Respiratory: Effort normal and breath sounds normal. No respiratory distress.  GI: Soft. Bowel sounds are normal. There is no tenderness.  Musculoskeletal: Normal range of motion.  Neurological: She is alert and oriented to person, place, and time.  Psychiatric: She has a normal mood and affect. Her behavior is normal.   O+/Ab Scr neg, Hgb 11.1/Pap WNL/ Rubella Immune/  RPR NR/ HepbsAg neg. HIV neg. Plt 331K/ TSH WNL/ Hgb electro WNL/GC neg/Chl neg/ First Tri Screen WNL/AFP WNL/ glucola 93/ GBBS +  [place] EDC 01/16/11, nl anat, ant plac, female  Assessment/Plan: 25yo G3P1011 @ 39+ for rLTCS. D/W pt R/B/A will proceed   BOVARD,Neyda Durango 01/09/2011, 5:04 PM

## 2011-01-10 ENCOUNTER — Encounter (HOSPITAL_COMMUNITY)
Admission: RE | Disposition: A | Discharge status: Home / Self Care | Payer: Self-pay | Source: Ambulatory Visit | Attending: Obstetrics and Gynecology

## 2011-01-10 ENCOUNTER — Inpatient Hospital Stay (HOSPITAL_COMMUNITY): Payer: Medicaid Other | Admitting: Anesthesiology

## 2011-01-10 ENCOUNTER — Encounter (HOSPITAL_COMMUNITY): Payer: Self-pay | Admitting: *Deleted

## 2011-01-10 ENCOUNTER — Inpatient Hospital Stay (HOSPITAL_COMMUNITY)
Admission: RE | Admit: 2011-01-10 | Discharge: 2011-01-13 | DRG: 766 | Disposition: A | Discharge status: Home / Self Care | Payer: Medicaid Other | Source: Ambulatory Visit | Attending: Obstetrics and Gynecology | Admitting: Obstetrics and Gynecology

## 2011-01-10 ENCOUNTER — Encounter (HOSPITAL_COMMUNITY): Payer: Self-pay | Admitting: Anesthesiology

## 2011-01-10 DIAGNOSIS — Z01812 Encounter for preprocedural laboratory examination: Secondary | ICD-10-CM

## 2011-01-10 DIAGNOSIS — Z98891 History of uterine scar from previous surgery: Secondary | ICD-10-CM

## 2011-01-10 DIAGNOSIS — O34219 Maternal care for unspecified type scar from previous cesarean delivery: Secondary | ICD-10-CM

## 2011-01-10 DIAGNOSIS — Z01818 Encounter for other preprocedural examination: Secondary | ICD-10-CM

## 2011-01-10 SURGERY — Surgical Case
Anesthesia: Spinal | Site: Abdomen | Wound class: Clean Contaminated

## 2011-01-10 MED ORDER — DIPHENHYDRAMINE HCL 25 MG PO CAPS: 25.0000 mg | ORAL_CAPSULE | Freq: Four times a day (QID) | ORAL | Status: DC | PRN

## 2011-01-10 MED ORDER — OXYTOCIN 20 UNITS IN LACTATED RINGERS INFUSION - SIMPLE
INTRAVENOUS | Status: DC | PRN
  Administered 2011-01-10: 20 [IU] via INTRAVENOUS

## 2011-01-10 MED ORDER — WITCH HAZEL-GLYCERIN EX PADS: 1.0000 "application " | MEDICATED_PAD | CUTANEOUS | Status: DC | PRN

## 2011-01-10 MED ORDER — ZOLPIDEM TARTRATE 10 MG PO TABS: 10.0000 mg | ORAL_TABLET | Freq: Every evening | ORAL | Status: DC | PRN

## 2011-01-10 MED ORDER — SIMETHICONE 80 MG PO CHEW: 80.0000 mg | CHEWABLE_TABLET | ORAL | Status: DC | PRN

## 2011-01-10 MED ORDER — NALOXONE HCL 0.4 MG/ML IJ SOLN: 0.4000 mg | INTRAMUSCULAR | Status: DC | PRN

## 2011-01-10 MED ORDER — DIPHENHYDRAMINE HCL 25 MG PO CAPS
25.0000 mg | ORAL_CAPSULE | ORAL | Status: DC | PRN
  Filled 2011-01-10: qty 1

## 2011-01-10 MED ORDER — SODIUM CHLORIDE 0.9 % IV SOLN: 1.0000 ug/kg/h | INTRAVENOUS | Status: DC | PRN

## 2011-01-10 MED ORDER — MORPHINE SULFATE 0.5 MG/ML IJ SOLN
INTRAMUSCULAR | Status: AC
  Filled 2011-01-10: qty 10

## 2011-01-10 MED ORDER — FENTANYL CITRATE 0.05 MG/ML IJ SOLN: 25.0000 ug | INTRAMUSCULAR | Status: DC | PRN

## 2011-01-10 MED ORDER — ONDANSETRON HCL 4 MG PO TABS: 4.0000 mg | ORAL_TABLET | ORAL | Status: DC | PRN

## 2011-01-10 MED ORDER — PHENYLEPHRINE 40 MCG/ML (10ML) SYRINGE FOR IV PUSH (FOR BLOOD PRESSURE SUPPORT)
PREFILLED_SYRINGE | INTRAVENOUS | Status: AC
  Filled 2011-01-10: qty 5

## 2011-01-10 MED ORDER — ZOLPIDEM TARTRATE 5 MG PO TABS: 5.0000 mg | ORAL_TABLET | Freq: Every evening | ORAL | Status: DC | PRN

## 2011-01-10 MED ORDER — DIBUCAINE 1 % RE OINT: 1.0000 "application " | TOPICAL_OINTMENT | RECTAL | Status: DC | PRN

## 2011-01-10 MED ORDER — OXYTOCIN 10 UNIT/ML IJ SOLN
INTRAMUSCULAR | Status: AC
  Filled 2011-01-10: qty 2

## 2011-01-10 MED ORDER — BUPIVACAINE IN DEXTROSE 0.75-8.25 % IT SOLN
INTRATHECAL | Status: AC
  Filled 2011-01-10: qty 2

## 2011-01-10 MED ORDER — OXYTOCIN 20 UNITS IN LACTATED RINGERS INFUSION - SIMPLE
125.0000 mL/h | INTRAVENOUS | Status: AC
  Administered 2011-01-10: 125 mL/h via INTRAVENOUS

## 2011-01-10 MED ORDER — PRENATAL MULTIVITAMIN CH
1.0000 | ORAL_TABLET | Freq: Every day | ORAL | Status: DC
  Administered 2011-01-11 – 2011-01-13 (×2): 1 via ORAL
  Filled 2011-01-10: qty 1

## 2011-01-10 MED ORDER — IBUPROFEN 600 MG PO TABS
600.0000 mg | ORAL_TABLET | Freq: Four times a day (QID) | ORAL | Status: DC | PRN
  Filled 2011-01-10: qty 1

## 2011-01-10 MED ORDER — NALBUPHINE HCL 10 MG/ML IJ SOLN
5.0000 mg | INTRAMUSCULAR | Status: DC | PRN
  Administered 2011-01-10: 10 mg via SUBCUTANEOUS
  Filled 2011-01-10 (×2): qty 1

## 2011-01-10 MED ORDER — PRENATAL PLUS 27-1 MG PO TABS
1.0000 | ORAL_TABLET | Freq: Every day | ORAL | Status: DC
  Administered 2011-01-10 – 2011-01-12 (×3): 1 via ORAL
  Filled 2011-01-10: qty 1

## 2011-01-10 MED ORDER — NALBUPHINE HCL 10 MG/ML IJ SOLN
5.0000 mg | INTRAMUSCULAR | Status: DC | PRN
  Administered 2011-01-10: 10 mg via INTRAVENOUS
  Filled 2011-01-10 (×2): qty 1

## 2011-01-10 MED ORDER — SENNOSIDES-DOCUSATE SODIUM 8.6-50 MG PO TABS
2.0000 | ORAL_TABLET | Freq: Every day | ORAL | Status: DC
  Administered 2011-01-10 – 2011-01-12 (×2): 2 via ORAL

## 2011-01-10 MED ORDER — LACTATED RINGERS IV SOLN
INTRAVENOUS | Status: DC
  Administered 2011-01-10: 125 mL/h via INTRAVENOUS

## 2011-01-10 MED ORDER — EPHEDRINE 5 MG/ML INJ
INTRAVENOUS | Status: AC
  Filled 2011-01-10: qty 10

## 2011-01-10 MED ORDER — CEFAZOLIN SODIUM-DEXTROSE 2-3 GM-% IV SOLR
2.0000 g | INTRAVENOUS | Status: AC
  Administered 2011-01-10: 2 g via INTRAVENOUS
  Filled 2011-01-10: qty 50

## 2011-01-10 MED ORDER — ONDANSETRON HCL 4 MG/2ML IJ SOLN: 4.0000 mg | INTRAMUSCULAR | Status: DC | PRN

## 2011-01-10 MED ORDER — SIMETHICONE 80 MG PO CHEW
80.0000 mg | CHEWABLE_TABLET | Freq: Three times a day (TID) | ORAL | Status: DC
  Administered 2011-01-10 – 2011-01-13 (×9): 80 mg via ORAL

## 2011-01-10 MED ORDER — MENTHOL 3 MG MT LOZG: 1.0000 | LOZENGE | OROMUCOSAL | Status: DC | PRN

## 2011-01-10 MED ORDER — SODIUM CHLORIDE 0.9 % IJ SOLN: 3.0000 mL | INTRAMUSCULAR | Status: DC | PRN

## 2011-01-10 MED ORDER — SCOPOLAMINE 1 MG/3DAYS TD PT72: 1.0000 | MEDICATED_PATCH | Freq: Once | TRANSDERMAL | Status: DC

## 2011-01-10 MED ORDER — ONDANSETRON HCL 4 MG/2ML IJ SOLN
INTRAMUSCULAR | Status: AC
  Filled 2011-01-10: qty 2

## 2011-01-10 MED ORDER — FENTANYL CITRATE 0.05 MG/ML IJ SOLN
INTRAMUSCULAR | Status: AC
  Filled 2011-01-10: qty 2

## 2011-01-10 MED ORDER — SCOPOLAMINE 1 MG/3DAYS TD PT72
1.0000 | MEDICATED_PATCH | Freq: Once | TRANSDERMAL | Status: DC
  Administered 2011-01-10: 1.5 mg via TRANSDERMAL

## 2011-01-10 MED ORDER — KETOROLAC TROMETHAMINE 30 MG/ML IJ SOLN: 30.0000 mg | Freq: Four times a day (QID) | INTRAMUSCULAR | Status: AC | PRN

## 2011-01-10 MED ORDER — PHENYLEPHRINE HCL 10 MG/ML IJ SOLN
INTRAMUSCULAR | Status: DC | PRN
  Administered 2011-01-10: 80 ug via INTRAVENOUS

## 2011-01-10 MED ORDER — DIPHENHYDRAMINE HCL 50 MG/ML IJ SOLN: 25.0000 mg | INTRAMUSCULAR | Status: DC | PRN

## 2011-01-10 MED ORDER — LACTATED RINGERS IV SOLN: INTRAVENOUS | Status: DC

## 2011-01-10 MED ORDER — ONDANSETRON HCL 4 MG/2ML IJ SOLN: 4.0000 mg | Freq: Three times a day (TID) | INTRAMUSCULAR | Status: DC | PRN

## 2011-01-10 MED ORDER — MORPHINE SULFATE (PF) 0.5 MG/ML IJ SOLN
INTRAMUSCULAR | Status: DC | PRN
  Administered 2011-01-10: 150 ug via INTRATHECAL

## 2011-01-10 MED ORDER — ONDANSETRON HCL 4 MG/2ML IJ SOLN
INTRAMUSCULAR | Status: DC | PRN
  Administered 2011-01-10: 4 mg via INTRAVENOUS

## 2011-01-10 MED ORDER — MEPERIDINE HCL 25 MG/ML IJ SOLN: 6.2500 mg | INTRAMUSCULAR | Status: DC | PRN

## 2011-01-10 MED ORDER — METOCLOPRAMIDE HCL 5 MG/ML IJ SOLN: 10.0000 mg | Freq: Three times a day (TID) | INTRAMUSCULAR | Status: DC | PRN

## 2011-01-10 MED ORDER — KETOROLAC TROMETHAMINE 60 MG/2ML IM SOLN
60.0000 mg | Freq: Once | INTRAMUSCULAR | Status: AC | PRN
  Administered 2011-01-10: 60 mg via INTRAMUSCULAR

## 2011-01-10 MED ORDER — TETANUS-DIPHTH-ACELL PERTUSSIS 5-2.5-18.5 LF-MCG/0.5 IM SUSP: 0.5000 mL | Freq: Once | INTRAMUSCULAR | Status: DC

## 2011-01-10 MED ORDER — IBUPROFEN 800 MG PO TABS
800.0000 mg | ORAL_TABLET | Freq: Three times a day (TID) | ORAL | Status: DC
  Administered 2011-01-10: 600 mg via ORAL
  Administered 2011-01-11: 800 mg via ORAL
  Filled 2011-01-10 (×2): qty 1

## 2011-01-10 MED ORDER — LANOLIN HYDROUS EX OINT: 1.0000 "application " | TOPICAL_OINTMENT | CUTANEOUS | Status: DC | PRN

## 2011-01-10 MED ORDER — FENTANYL CITRATE 0.05 MG/ML IJ SOLN
INTRAMUSCULAR | Status: DC | PRN
  Administered 2011-01-10: 25 ug via INTRATHECAL

## 2011-01-10 MED ORDER — BUPIVACAINE IN DEXTROSE 0.75-8.25 % IT SOLN
INTRATHECAL | Status: DC | PRN
  Administered 2011-01-10: 14 mg via INTRATHECAL

## 2011-01-10 MED ORDER — DIPHENHYDRAMINE HCL 50 MG/ML IJ SOLN: 12.5000 mg | INTRAMUSCULAR | Status: DC | PRN

## 2011-01-10 MED ORDER — EPHEDRINE SULFATE 50 MG/ML IJ SOLN
INTRAMUSCULAR | Status: DC | PRN
  Administered 2011-01-10: 10 mg via INTRAVENOUS

## 2011-01-10 MED ORDER — LACTATED RINGERS IV SOLN
INTRAVENOUS | Status: DC
  Administered 2011-01-10: 20:00:00 via INTRAVENOUS

## 2011-01-10 MED ORDER — KETOROLAC TROMETHAMINE 60 MG/2ML IM SOLN
INTRAMUSCULAR | Status: AC
  Administered 2011-01-10: 60 mg via INTRAMUSCULAR
  Filled 2011-01-10: qty 2

## 2011-01-10 MED ORDER — OXYCODONE-ACETAMINOPHEN 5-325 MG PO TABS
1.0000 | ORAL_TABLET | ORAL | Status: DC | PRN
  Administered 2011-01-11 – 2011-01-13 (×7): 1 via ORAL
  Filled 2011-01-10 (×3): qty 1
  Filled 2011-01-10: qty 2
  Filled 2011-01-10 (×3): qty 1

## 2011-01-10 MED ORDER — OXYTOCIN 20 UNITS IN LACTATED RINGERS INFUSION - SIMPLE
INTRAVENOUS | Status: AC
  Filled 2011-01-10: qty 1000

## 2011-01-10 SURGICAL SUPPLY — 33 items
APL SKNCLS STERI-STRIP NONHPOA (GAUZE/BANDAGES/DRESSINGS) ×1
BENZOIN TINCTURE PRP APPL 2/3 (GAUZE/BANDAGES/DRESSINGS) ×1 IMPLANT
CHLORAPREP W/TINT 26ML (MISCELLANEOUS) ×2 IMPLANT
CLOSURE STERI STRIP 1/2 X4 (GAUZE/BANDAGES/DRESSINGS) ×1 IMPLANT
CLOTH BEACON ORANGE TIMEOUT ST (SAFETY) ×2 IMPLANT
CONTAINER PREFILL 10% NBF 15ML (MISCELLANEOUS) IMPLANT
DRSG COVADERM 4X8 (GAUZE/BANDAGES/DRESSINGS) ×1 IMPLANT
ELECT REM PT RETURN 9FT ADLT (ELECTROSURGICAL) ×2
ELECTRODE REM PT RTRN 9FT ADLT (ELECTROSURGICAL) ×1 IMPLANT
EXTRACTOR VACUUM M CUP 4 TUBE (SUCTIONS) IMPLANT
GLOVE BIO SURGEON STRL SZ 6.5 (GLOVE) ×2 IMPLANT
GLOVE BIO SURGEON STRL SZ7 (GLOVE) ×2 IMPLANT
GOWN PREVENTION PLUS LG XLONG (DISPOSABLE) ×6 IMPLANT
KIT ABG SYR 3ML LUER SLIP (SYRINGE) IMPLANT
NDL HYPO 25X5/8 SAFETYGLIDE (NEEDLE) IMPLANT
NEEDLE HYPO 25X5/8 SAFETYGLIDE (NEEDLE) IMPLANT
NS IRRIG 1000ML POUR BTL (IV SOLUTION) ×2 IMPLANT
PACK C SECTION WH (CUSTOM PROCEDURE TRAY) ×2 IMPLANT
RTRCTR C-SECT PINK 25CM LRG (MISCELLANEOUS) ×2 IMPLANT
SLEEVE SCD COMPRESS KNEE MED (MISCELLANEOUS) IMPLANT
STAPLER VISISTAT 35W (STAPLE) IMPLANT
SUT MNCRL 0 VIOLET CTX 36 (SUTURE) ×2 IMPLANT
SUT MONOCRYL 0 CTX 36 (SUTURE) ×2
SUT PLAIN 1 NONE 54 (SUTURE) IMPLANT
SUT PLAIN 2 0 XLH (SUTURE) ×2 IMPLANT
SUT VIC AB 0 CT1 27 (SUTURE) ×4
SUT VIC AB 0 CT1 27XBRD ANBCTR (SUTURE) ×2 IMPLANT
SUT VIC AB 2-0 CT1 27 (SUTURE) ×2
SUT VIC AB 2-0 CT1 TAPERPNT 27 (SUTURE) ×1 IMPLANT
SYR BULB IRRIGATION 50ML (SYRINGE) ×2 IMPLANT
TOWEL OR 17X24 6PK STRL BLUE (TOWEL DISPOSABLE) ×4 IMPLANT
TRAY FOLEY CATH 14FR (SET/KITS/TRAYS/PACK) IMPLANT
WATER STERILE IRR 1000ML POUR (IV SOLUTION) ×2 IMPLANT

## 2011-01-10 NOTE — Anesthesia Procedure Notes (Signed)
Spinal  Patient location during procedure: OR Start time: 01/10/2011 9:23 AM Staffing Anesthesiologist: Jiovany Scheffel A. Performed by: anesthesiologist  Preanesthetic Checklist Completed: patient identified, site marked, surgical consent, pre-op evaluation, timeout performed, IV checked, risks and benefits discussed and monitors and equipment checked Spinal Block Patient position: sitting Prep: site prepped and draped and DuraPrep Patient monitoring: heart rate, cardiac monitor, continuous pulse ox and blood pressure Approach: midline Location: L3-4 Injection technique: single-shot Needle Needle type: Tuohy and Pencan  Needle gauge: 25 G Needle length: 12.7 cm Assessment Sensory level: T4 Additional Notes Patient very anxious moving during procedure. Attempt x3 17Ga Touhy LOR with air. 25ga Pencan through epidural needle. Adequate sensory level.

## 2011-01-10 NOTE — Brief Op Note (Signed)
01/10/2011  10:36 AM  PATIENT:  Alison Gomez  26 y.o. female  PRE-OPERATIVE DIAGNOSIS:  history of HPV  repeat cesarean section  POST-OPERATIVE DIAGNOSIS:  history of HPV repeat cesarean section  PROCEDURE:  Procedure(s): CESAREAN SECTION  SURGEON:  Surgeon(s): Sherron Monday, MD Zenaida Niece, MD   ANESTHESIA:   spinal  EBL:  Total I/O In: 2800 [I.V.:2800] Out: 900 [Urine:100; Blood:800]  FINDINGS: viable female infant @ 9:45am, apgars  9 and 9, wt = 7#13; nl uterus, tubes and ovaries, filmy adhesions  BLOOD ADMINISTERED:none  DRAINS: none   LOCAL MEDICATIONS USED:  NONE  SPECIMEN:  Source of Specimen:  placenta  DISPOSITION OF SPECIMEN:  L&D  COUNTS:  YES  DICTATION: .Other Dictation: Dictation Number [id number]  PLAN OF CARE: Admit to inpatient   PATIENT DISPOSITION:  PACU - hemodynamically stable.

## 2011-01-10 NOTE — Anesthesia Preprocedure Evaluation (Addendum)
Anesthesia Evaluation Anesthesia Physical Anesthesia Plan Anesthesia Quick Evaluation  

## 2011-01-10 NOTE — Transfer of Care (Signed)
Immediate Anesthesia Transfer of Care Note  Patient: Alison Gomez  Procedure(s) Performed:  CESAREAN SECTION - repeat  c/s  Patient Location: PACU  Anesthesia Type: Spinal  Level of Consciousness: awake, alert  and oriented  Airway & Oxygen Therapy: Patient Spontanous Breathing  Post-op Assessment: Report given to PACU RN and Post -op Vital signs reviewed and stable  Post vital signs: stable  Complications: No apparent anesthesia complications

## 2011-01-10 NOTE — H&P (View-Only) (Signed)
Leilanie C Ivins is an 26 y.o. female G3P1011 @ 39+ for rLTCS.  +FM, no LOF, no VB, occ ctx; Uncomplicated Prenatal care, has h/o 10#+ baby.  D/W pt R/B/A of rLTCS, including bleeding/infection/damage to surrounding organs/ also trouble healing, pt voices understanding,    OB/Gyn History: G3P1011 G1 TAB, G2 LTCS, 10#7, G3 present No abnormal pap; h/o trich    Past Medical History  Diagnosis Date  . Herpes genitalia   . Panic attack   . Trichomonas   . No pertinent past medical history     Past Surgical History  Procedure Date  . Cesarean section   . Induced abortion     Family History  Problem Relation Age of Onset  . Anesthesia problems Neg Hx   pt denies  Social History:  reports that she has never smoked. She has never used smokeless tobacco. She reports that she drinks alcohol. She reports that she does not use illicit drugs.  Allergies: No Known Allergies, chocolate Meds PNV  Review of Systems  Constitutional: Negative.   HENT: Negative.   Eyes: Negative.   Respiratory: Negative.   Cardiovascular: Negative.   Gastrointestinal: Negative.   Genitourinary: Negative.   Musculoskeletal: Negative.   Skin: Negative.   Neurological: Negative.   Psychiatric/Behavioral: Negative.     Last menstrual period 06/30/2010. Physical Exam  Constitutional: She is oriented to person, place, and time. She appears well-developed and well-nourished.  HENT:  Head: Normocephalic and atraumatic.  Neck: Normal range of motion. Neck supple. No thyromegaly present.  Cardiovascular: Normal rate and regular rhythm.   Respiratory: Effort normal and breath sounds normal. No respiratory distress.  GI: Soft. Bowel sounds are normal. There is no tenderness.  Musculoskeletal: Normal range of motion.  Neurological: She is alert and oriented to person, place, and time.  Psychiatric: She has a normal mood and affect. Her behavior is normal.   O+/Ab Scr neg, Hgb 11.1/Pap WNL/ Rubella Immune/  RPR NR/ HepbsAg neg. HIV neg. Plt 331K/ TSH WNL/ Hgb electro WNL/GC neg/Chl neg/ First Tri Screen WNL/AFP WNL/ glucola 93/ GBBS +  [place] EDC 01/16/11, nl anat, ant plac, female  Assessment/Plan: 26yo G3P1011 @ 39+ for rLTCS. D/W pt R/B/A will proceed   BOVARD,Rabecca Birge 01/09/2011, 5:04 PM  

## 2011-01-10 NOTE — Addendum Note (Signed)
Addendum  created 01/10/11 1104 by Sandrea Hughs., MD   Modules edited:Orders, PRL Based Order Sets

## 2011-01-10 NOTE — Anesthesia Postprocedure Evaluation (Signed)
Anesthesia Post Note  Patient: Alison Gomez  Procedure(s) Performed:  CESAREAN SECTION - repeat  c/s  Anesthesia type: Spinal  Patient location: PACU  Post pain: Pain level controlled  Post assessment: Post-op Vital signs reviewed  Last Vitals:  Filed Vitals:   01/10/11 0805  BP: 123/67  Pulse: 69  Temp: 36.7 C  Resp: 20    Post vital signs: Reviewed  Level of consciousness: awake  Complications: No apparent anesthesia complications

## 2011-01-10 NOTE — Interval H&P Note (Signed)
History and Physical Interval Note:  01/10/2011 8:54 AM  Alison Gomez  has presented today for surgery, with the diagnosis of history of HPV  repeat cesarean section  The various methods of treatment have been discussed with the patient and family. After consideration of risks, benefits and other options for treatment, the patient has consented to  Procedure(s): CESAREAN SECTION as a surgical intervention .  The patients' history has been reviewed, patient examined, no change in status, stable for surgery.  I have reviewed the patients' chart and labs.  Questions were answered to the patient's satisfaction.     BOVARD,Surah Pelley  H&P reviewed, no changes.  Procedure reviewed including R/B/A, wishes to proceed

## 2011-01-10 NOTE — Consult Note (Signed)
Called to attend a repeat elective C/S with history of macrosomia with prior pregnancy. No reported risk factors with this current pregnancy.  Mother's glucose tolerance test reported normal.   At delivery infant in vertex and was manually extracted with lusty initial cry and movement of all extremities.  Placed under radiant warmer and provided tactile stimulation with drying and bulb suction to naso/oropharynx yielding minimal fluid.  No dysmorphic features noted. Infant voided twice following delivery.  Shown to parents and then care transferred to Transitional Nursery RN and to assigned pediatrician.   Dagoberto Ligas MD [facility]

## 2011-01-11 ENCOUNTER — Encounter (HOSPITAL_COMMUNITY): Payer: Self-pay | Admitting: *Deleted

## 2011-01-11 LAB — CBC
MCH: 25.9 pg — ABNORMAL LOW (ref 26.0–34.0)
MCHC: 31.6 g/dL (ref 30.0–36.0)
MCV: 81.8 fL (ref 78.0–100.0)
Platelets: 253 10*3/uL (ref 150–400)
RDW: 15.9 % — ABNORMAL HIGH (ref 11.5–15.5)

## 2011-01-11 MED ORDER — IBUPROFEN 800 MG PO TABS
800.0000 mg | ORAL_TABLET | Freq: Three times a day (TID) | ORAL | Status: DC
  Administered 2011-01-11 – 2011-01-13 (×6): 800 mg via ORAL
  Filled 2011-01-11 (×6): qty 1

## 2011-01-11 NOTE — Progress Notes (Signed)
UR chart review completed.  

## 2011-01-11 NOTE — Op Note (Signed)
NAMEJAVIER, MAMONE NO.:  [id number]  MEDICAL RECORD NO.:  [id number]  LOCATION:  9103                          FACILITY:  [facility]  PHYSICIAN:  Sherron Monday, MD        DATE OF BIRTH:  [birth year]  DATE OF PROCEDURE:  01/10/2011 DATE OF DISCHARGE:                              OPERATIVE REPORT   PREOPERATIVE DIAGNOSES:  Intrauterine pregnancy at term, history of low- transverse cesarean section, desires repeat, declines vaginal birth after cesarean section.  POSTOPERATIVE DIAGNOSES:  Intrauterine pregnancy at term, history of low- transverse cesarean section, desires repeat, declines vaginal birth after cesarean section, delivered.  PROCEDURE:  Repeat low transverse cesarean section.  SURGEON:  Sherron Monday, MD  ASSISTANT:  Zenaida Niece, MD  ANESTHESIA:  Spinal.  EBL:  800 mL.  IV FLUIDS:  2800 mL urine output, 100 mL clear urine at the end of the procedure.  FINDINGS:  Viable female infant at 9:45 a.m. with Apgars of 9 at 1 minute, 9 at 5 minutes and a weight of 7 pounds 13 ounces.  Normal uterus, tubes, and ovaries.  Some adhesions were noted during the procedure.  COMPLICATIONS:  None.  Pathology placenta to disposal.  DISPOSITION:  Stable to the PACU.  PROCEDURE IN DETAIL:  After informed consent was reviewed with patient including risks, benefits, and alternatives of surgical procedure.  She was transported to the OR, placed on the table in supine position with a leftward tilt.  After spinal anesthesia had been placed, her abdomen was sterilely prepped and draped.  A Foley catheter was sterilely placed. Gloves were changed.  Attention was turned to performing the case. Pfannenstiel skin incision was made at the level of previous incision, carried through the underlying layer of fascia sharply.  The fascia was incised in midline and incision was extended laterally with Mayo scissors.  The inferior aspect of the fascial incision was grasped  with Kocher clamps, elevating the rectus muscles that were dissected off both bluntly and sharply.  Attention was then turned to the superior portion which in a similar fashion, grasped with Kocher clamps, elevating the rectus muscles that were dissected off both bluntly and sharply. Peritoneum was entered bluntly.  Incision was extended superiorly and inferiorly with good visualization of the bladder sharply.  Alexis skin retractor was placed without complication.  Carefully checking to make sure no bowel was entrapped.  The uterus after filmy adhesions had been taken down, the uterus was inspected.  The vesicouterine peritoneum was identified, tented up with smooth pickups and the bladder flap was created both digitally and sharply.  Uterus was incised in transverse fashion.  The infant was delivered from vertex presentation.  Nose and mouth were suctioned.  Cord was clamped and cut.  Infant was handed off to the awaiting pediatric staff.  Placenta was then expressed given for cord blood collection.  The uterus was cleared of all clot and debris. The uterine incision was closed with 2 layers of 0 Monocryl, the first one which was running locked and the second was an imbricating layer.  A figure-of-eight was used to make this incision hemostatic.  Copious pelvic irrigation was performed.  The  Alexis retractor was removed.  The peritoneum was reapproximated using 2-0 Vicryl.  The fascial incision was closed with 0 Vicryl in a running fashion from either side meeting in the middle.  Subcuticular adipose layer was made hemostatic with Bovie cautery, reapproximated using plain gut.  After it was irrigated, skin was closed with 3-0 Vicryl in a subcuticular fashion.  The patient tolerated the procedure well.  Sponge, lap, and needle count was correct x2.     Sherron Monday, MD     JB/MEDQ  D:  01/10/2011  T:  01/11/2011  Job:  [id number]

## 2011-01-11 NOTE — Progress Notes (Signed)
Subjective: Postpartum Day 1: Cesarean Delivery Patient reports incisional pain, tolerating PO and no problems voiding.  Nl lochia, pain controlled  Objective: Vital signs in last 24 hours: Temp:  [97.2 F (36.2 C)-98.1 F (36.7 C)] 97.6 F (36.4 C) (01/09 0617) Pulse Rate:  [49-65] 60  (01/09 0617) Resp:  [16-62] 16  (01/09 0617) BP: (109-131)/(54-85) 120/72 mmHg (01/09 0617) SpO2:  [96 %-100 %] 97 % (01/09 0617) Weight:  [115.214 kg (254 lb)] 115.214 kg (254 lb) (01/08 1104)  Physical Exam:  General: alert and no distress Lochia: appropriate Uterine Fundus: firm Incision: healing well DVT Evaluation: No evidence of DVT seen on physical exam.   Basename 01/11/11 0500  HGB 7.4*  HCT 23.4*    Assessment/Plan: Status post Cesarean section. Doing well postoperatively.  Continue current care.  BOVARD,Chermaine Schnyder 01/11/2011, 8:31 AM

## 2011-01-12 NOTE — Progress Notes (Signed)
Subjective: Postpartum Day 2: Cesarean Delivery Patient reports incisional pain, tolerating PO and no problems voiding.  Nl lochia, pain controlled  Objective: Vital signs in last 24 hours: Temp:  [98.2 F (36.8 C)-98.6 F (37 C)] 98.4 F (36.9 C) (01/10 0529) Pulse Rate:  [61-81] 81  (01/10 0529) Resp:  [18-20] 18  (01/10 0529) BP: (103-126)/(57-82) 117/79 mmHg (01/10 0529) SpO2:  [96 %-99 %] 99 % (01/09 1936)  Physical Exam:  General: alert and no distress Lochia: appropriate Uterine Fundus: firm Incision: healing well DVT Evaluation: No evidence of DVT seen on physical exam.   Basename 01/11/11 0500  HGB 7.4*  HCT 23.4*    Assessment/Plan: Status post Cesarean section. Doing well postoperatively.  Continue current care.  Plan d/c tomorrow.    BOVARD,Crystalynn Mcinerney 01/12/2011, 8:34 AM

## 2011-01-13 MED ORDER — IBUPROFEN 800 MG PO TABS: 800.0000 mg | ORAL_TABLET | Freq: Three times a day (TID) | ORAL | Status: AC

## 2011-01-13 MED ORDER — OXYCODONE-ACETAMINOPHEN 5-325 MG PO TABS: 1.0000 | ORAL_TABLET | ORAL | Status: AC | PRN

## 2011-01-13 MED ORDER — PRENATAL MULTIVITAMIN CH: 1.0000 | ORAL_TABLET | Freq: Every day | ORAL | Status: DC

## 2011-01-13 NOTE — Discharge Summary (Signed)
Obstetric Discharge Summary Reason for Admission: cesarean section Prenatal Procedures: none Intrapartum Procedures: cesarean: low cervical, transverse Postpartum Procedures: none Complications-Operative and Postpartum: none Hemoglobin  Date Value Range Status  01/11/2011 7.4* 12.0-15.0 (g/dL) Final     HCT  Date Value Range Status  01/11/2011 23.4* 36.0-46.0 (%) Final    Discharge Diagnoses: Term Pregnancy-delivered  Discharge Information: Date: 01/13/2011 Activity: pelvic rest Diet: routine Medications: PNV, Ibuprofen and Percocet Condition: stable Instructions: refer to practice specific booklet Discharge to: home Follow-up Information    Follow up with BOVARD,Duvan Mousel, MD. Schedule an appointment as soon as possible for a visit in 2 weeks.   Contact information:   [address] [facility] [address] [place] [address] [phone]          Newborn Data: Live born female  Birth Weight: 7 lb 13.4 oz (3555 g) APGAR: 9, 9  Home with mother.  BOVARD,Chike Farrington 01/13/2011, 7:40 AM

## 2011-01-13 NOTE — Progress Notes (Signed)
Subjective: Postpartum Day 3: Cesarean Delivery Patient reports incisional pain, tolerating PO and no problems voiding.  Nl lochia, pain controlled  Objective: Vital signs in last 24 hours: Temp:  [98 F (36.7 C)-98.7 F (37.1 C)] 98.5 F (36.9 C) (01/11 0502) Pulse Rate:  [59-73] 64  (01/11 0502) Resp:  [18] 18  (01/11 0502) BP: (121-127)/(64-79) 127/64 mmHg (01/11 0502)  Physical Exam:  General: alert and no distress Lochia: appropriate Uterine Fundus: firm Incision: healing well DVT Evaluation: No evidence of DVT seen on physical exam.   Basename 01/11/11 0500  HGB 7.4*  HCT 23.4*    Assessment/Plan: Status post Cesarean section. Doing well postoperatively.  Discharge home with standard precautions and return to clinic in 2 weeks.  D/C with motrin, Vicodin, PNV  BOVARD,Lateesha Bezold 01/13/2011, 7:34 AM

## 2011-01-13 NOTE — Progress Notes (Signed)
Pt was discharged before [facility] could see.  Referral reason: history of anxiety.

## 2011-06-24 ENCOUNTER — Encounter (HOSPITAL_BASED_OUTPATIENT_CLINIC_OR_DEPARTMENT_OTHER): Payer: Self-pay | Admitting: *Deleted

## 2011-06-24 ENCOUNTER — Emergency Department (HOSPITAL_BASED_OUTPATIENT_CLINIC_OR_DEPARTMENT_OTHER)
Admission: EM | Admit: 2011-06-24 | Discharge: 2011-06-24 | Disposition: A | Discharge status: Home / Self Care | Payer: 59 | Attending: Emergency Medicine | Admitting: Emergency Medicine

## 2011-06-24 DIAGNOSIS — S0500XA Injury of conjunctiva and corneal abrasion without foreign body, unspecified eye, initial encounter: Secondary | ICD-10-CM

## 2011-06-24 DIAGNOSIS — S058X9A Other injuries of unspecified eye and orbit, initial encounter: Secondary | ICD-10-CM | POA: Insufficient documentation

## 2011-06-24 DIAGNOSIS — T1590XA Foreign body on external eye, part unspecified, unspecified eye, initial encounter: Secondary | ICD-10-CM | POA: Insufficient documentation

## 2011-06-24 MED ORDER — TETRACAINE HCL 0.5 % OP SOLN
OPHTHALMIC | Status: AC
  Filled 2011-06-24: qty 2

## 2011-06-24 MED ORDER — FLUORESCEIN SODIUM 1 MG OP STRP
ORAL_STRIP | OPHTHALMIC | Status: AC
  Filled 2011-06-24: qty 1

## 2011-06-24 MED ORDER — TOBRAMYCIN 0.3 % OP SOLN: 1.0000 [drp] | OPHTHALMIC | Status: AC

## 2011-06-24 NOTE — ED Notes (Signed)
Pt states her son poked her in the right eye. Tearful. Unable to open.

## 2011-06-24 NOTE — ED Provider Notes (Signed)
History   This chart was scribed for Rolan Bucco, MD by Charolett Bumpers . The patient was seen in room MH11/MH11.    CSN: [id number]  Arrival date & time 06/24/11  2018   First MD Initiated Contact with Patient 06/24/11 2028      Chief Complaint  Patient presents with  . Eye Injury    (Consider location/radiation/quality/duration/timing/severity/associated sxs/prior treatment) HPI Alison Gomez is a 26 y.o. female who presents to the Emergency Department complaining of constant, moderate right eye pain for the past 2 hours. Patient states that her son poked her right eye. Patient reports associated pain, light sensitivity, watery discharge and states that she is unable to open her right eye. Patient also reports an associated headache. Patient denies any n/v. Patient states that her tetanus is not UTD. Patient denies any prior medical problems. Patient states that her LNMP was last week. Patient states that she took a percocet with some relief.    Past Medical History  Diagnosis Date  . Herpes genitalia   . Panic attack   . Trichomonas   . No pertinent past medical history   . Previous cesarean delivery affecting pregnancy 01/10/2011    Past Surgical History  Procedure Date  . Cesarean section   . Induced abortion   . Cesarean section 01/10/2011    Procedure: CESAREAN SECTION;  Surgeon: Sherron Monday, MD;  Location: [facility];  Service: Gynecology;  Laterality: N/A;  repeat  c/s    Family History  Problem Relation Age of Onset  . Anesthesia problems Neg Hx     History  Substance Use Topics  . Smoking status: Never Smoker   . Smokeless tobacco: Never Used  . Alcohol Use: Yes     not during preg    OB History    Grav Para Term Preterm Abortions TAB SAB Ect Mult Living   4 2 2  1 1    2       Review of Systems  Constitutional: Negative for fever.  HENT: Negative for congestion, sore throat and rhinorrhea.   Eyes: Positive for photophobia, pain and discharge.   Respiratory: Negative for cough and shortness of breath.   Gastrointestinal: Negative for nausea, vomiting and abdominal pain.  Skin: Negative for rash.  All other systems reviewed and are negative.    Allergies  Chocolate  Home Medications   Current Outpatient Rx  Name Route Sig Dispense Refill  . OXYCODONE-ACETAMINOPHEN 5-325 MG PO TABS Oral Take 1 tablet by mouth every 4 (four) hours as needed. Patient used this medication for pain.    . TOBRAMYCIN SULFATE 0.3 % OP SOLN Right Eye Place 1 drop into the right eye every 4 (four) hours. 5 mL 0    BP 147/78  Pulse 52  Temp 98 F (36.7 C) (Oral)  Resp 20  Ht 5\' 10"  (1.778 m)  Wt 220 lb (99.791 kg)  BMI 31.57 kg/m2  SpO2 100%  LMP 06/16/2011  Breastfeeding? No  Physical Exam  Nursing note and vitals reviewed. Constitutional: She is oriented to person, place, and time. She appears well-developed and well-nourished. No distress.  HENT:  Head: Normocephalic and atraumatic.  Eyes: EOM are normal. Pupils are equal, round, and reactive to light. Right eye exhibits discharge. No foreign body present in the right eye.  Slit lamp exam:      The right eye shows corneal abrasion and fluorescein uptake. The right eye shows no foreign body.  Right eye with small corneal abrasion at the 6 o'clock position visualized with fluorescein stain. Watery drainage noted. Mild erythema to conjunctiva. No foreign bodies. EOM intact. PERRL.   Neck: Neck supple. No tracheal deviation present.  Cardiovascular: Normal rate.   Pulmonary/Chest: Effort normal. No respiratory distress.  Abdominal: Soft. She exhibits no distension.  Musculoskeletal: Normal range of motion. She exhibits no edema.  Neurological: She is alert and oriented to person, place, and time. No sensory deficit.  Skin: Skin is warm and dry.  Psychiatric: She has a normal mood and affect. Her behavior is normal.    ED Course  Procedures (including critical care  time)  DIAGNOSTIC STUDIES: Oxygen Saturation is 100% on room air, normal by my interpretation.    COORDINATION OF CARE:  2050: Medication Orders: Tetracaine (Pontocaine) 0.5% ophthalmic solution; Fluorescein 1 mg ophthalmic strip.  2054: Discussed planned course of treatment with the patient, who is agreeable at this time. Discussed f/u on Monday if symptoms do not improve.     Labs Reviewed - No data to display No results found.   1. Corneal abrasion       MDM  Will start abx eye drops, updated TDAP, f/u with opthamology if no better in 2 days  I personally performed the services described in this documentation, which was scribed in my presence.  The recorded information has been reviewed and considered.        Rolan Bucco, MD 06/24/11 [phone]

## 2011-06-24 NOTE — Discharge Instructions (Signed)
Corneal Abrasion The cornea is the clear covering at the front and center of the eye. When looking at the colored portion (iris) of the eye, you are looking through that person's cornea.  This very thin tissue is made up of many layers. The surface layer is a single layer of cells called the corneal epithelium. This is one of the most sensitive tissues in the body. If a scratch or injury causes the corneal epithelium to come off, it is called a corneal abrasion. If the injury extends to the tissues below the epithelium, the condition is called a corneal ulcer.  CAUSES   Scratches.   Trauma.   Foreign body in the eye.   Some people have recurrences of abrasions in the area of the original injury even after they heal. This is called recurrent erosion syndrome. Recurrent erosion syndromes generally improve and go away with time.  SYMPTOMS   Eye pain.   Difficulty or inability to keep the injured eye open.   The eye becomes very sensitive to light.   Recurrent erosions tend to happen suddenly, first thing in the morning - usually upon awakening and opening the eyes.  DIAGNOSIS  Your eye professional can diagnose a corneal abrasion during an eye exam. Dye is usually placed in the eye using a drop or a small paper strip moistened by the patient's tears. When the eye is examined with a special light, the abrasion shows up clearly because of the dye. TREATMENT   Small abrasions may be treated with antibiotic drops or ointment alone.   Usually a pressure patch is specially applied. Pressure patches prevent the eye from blinking, allowing the corneal epithelium to heal. Because blinking is less, a pressure patch also reduces the amount of pain present in the eye during healing. Most corneal abrasions heal within 2-3 days with no effect on vision. WARNING: Do not drive or operate machinery while your eye is patched. Your ability to judge distances is impaired.   If abrasion becomes infected and  spreads to the deeper tissues of the cornea, a corneal ulcer can result. This is serious because it can cause corneal scarring. Corneal scars interfere with light passing through the cornea, and cause a loss of vision in the involved eye.   If your caregiver has given you a follow-up appointment, it is very important to keep that appointment. Not keeping the appointment could result in a severe eye infection or permanent loss of vision. If there is any problem keeping the appointment, you must call back to this facility for assistance.  SEEK MEDICAL CARE IF:   You have pain, light sensitivity and a scratchy feeling in one eye (or both).   Your pressure patch keeps loosening up and you can blink your eye under the patch after treatment.   Any kind of discharge develops from the involved eye after treatment or if the lids stick together in the morning.   You have the same symptoms in the morning as you did with the original abrasion days, weeks or months after the abrasion healed.  MAKE SURE YOU:   Understand these instructions.   Will watch your condition.   Will get help right away if you are not doing well or get worse.  Document Released: 12/17/1999 Document Revised: 12/08/2010 Document Reviewed: 07/25/2007 Exit[employer] Patient Information 2012 [employer]. 

## 2012-01-03 DIAGNOSIS — Z8679 Personal history of other diseases of the circulatory system: Secondary | ICD-10-CM

## 2012-01-03 HISTORY — DX: Personal history of other diseases of the circulatory system: Z86.79

## 2012-08-27 ENCOUNTER — Emergency Department (HOSPITAL_COMMUNITY)
Admission: EM | Admit: 2012-08-27 | Discharge: 2012-08-27 | Disposition: A | Discharge status: Home / Self Care | Payer: Self-pay | Attending: Emergency Medicine | Admitting: Emergency Medicine

## 2012-08-27 ENCOUNTER — Encounter (HOSPITAL_COMMUNITY): Payer: Self-pay

## 2012-08-27 ENCOUNTER — Emergency Department (HOSPITAL_COMMUNITY): Payer: Self-pay

## 2012-08-27 DIAGNOSIS — F419 Anxiety disorder, unspecified: Secondary | ICD-10-CM

## 2012-08-27 DIAGNOSIS — F410 Panic disorder [episodic paroxysmal anxiety] without agoraphobia: Secondary | ICD-10-CM | POA: Insufficient documentation

## 2012-08-27 DIAGNOSIS — R0789 Other chest pain: Secondary | ICD-10-CM | POA: Insufficient documentation

## 2012-08-27 DIAGNOSIS — Z8619 Personal history of other infectious and parasitic diseases: Secondary | ICD-10-CM | POA: Insufficient documentation

## 2012-08-27 MED ORDER — KETOROLAC TROMETHAMINE 30 MG/ML IJ SOLN
30.0000 mg | Freq: Once | INTRAMUSCULAR | Status: AC
  Administered 2012-08-27: 30 mg via INTRAMUSCULAR
  Filled 2012-08-27: qty 1

## 2012-08-27 NOTE — ED Provider Notes (Addendum)
CSN: [id number]     Arrival date & time 08/27/12  1404 History   First MD Initiated Contact with Patient 08/27/12 1447     Chief Complaint  Patient presents with  . Chest Pain  . Panic Attack   (Consider location/radiation/quality/duration/timing/severity/associated sxs/prior Treatment) HPI This a 27 rolled female with history of panic attacks who presents with 2 days of chest pain. The patient reports pain in the right side of her chest that moves to the left. She reports it feels like someone punched her. She states the pain is gotten worse. Her current pain is 5/10. She states it is worse when she moves, lifts her arm, coughs, or breathes.  She denies any shortness of breath. She denies any history of blood clots. Patient has no family history of sudden death. Past Medical History  Diagnosis Date  . Herpes genitalia   . Panic attack   . Trichomonas   . No pertinent past medical history   . Previous cesarean delivery affecting pregnancy 01/10/2011   Past Surgical History  Procedure Laterality Date  . Cesarean section    . Induced abortion    . Cesarean section  01/10/2011    Procedure: CESAREAN SECTION;  Surgeon: Sherron Monday, MD;  Location: [facility];  Service: Gynecology;  Laterality: N/A;  repeat  c/s   Family History  Problem Relation Age of Onset  . Anesthesia problems Neg Hx    History  Substance Use Topics  . Smoking status: Never Smoker   . Smokeless tobacco: Never Used  . Alcohol Use: Yes     Comment: not during preg   OB History   Grav Para Term Preterm Abortions TAB SAB Ect Mult Living   4 2 2  1 1    2      Review of Systems  Constitutional: Negative for fever.  Respiratory: Negative for cough, chest tightness and shortness of breath.   Cardiovascular: Negative for chest pain.  Gastrointestinal: Negative for nausea, vomiting and abdominal pain.  Genitourinary: Negative for dysuria.  Musculoskeletal: Negative for back pain.       Denies leg swelling  Skin:  Negative for wound.  Neurological: Negative for dizziness and syncope.  Psychiatric/Behavioral: Negative for confusion. The patient is nervous/anxious.   All other systems reviewed and are negative.    Allergies  Chocolate  Home Medications  No current outpatient prescriptions on file. BP 130/90  Pulse 62  Temp(Src) 97.4 F (36.3 C) (Oral)  Resp 22  SpO2 100%  LMP 08/02/2012 Physical Exam  Nursing note and vitals reviewed. Constitutional: She is oriented to person, place, and time. She appears well-developed and well-nourished.  Tearful  HENT:  Head: Normocephalic and atraumatic.  Neck: Neck supple.  Cardiovascular: Normal rate, regular rhythm and normal heart sounds.   Pulmonary/Chest: Effort normal and breath sounds normal. No respiratory distress. She has no wheezes.  Tenderness to palpation over the right sternum, reproduces pain  Abdominal: Soft. Bowel sounds are normal. There is no tenderness.  Musculoskeletal: She exhibits no edema.  Neurological: She is alert and oriented to person, place, and time.  Skin: Skin is warm and dry.  Psychiatric:  Anxious and tearful    ED Course  Procedures (including critical care time) Labs Review Labs Reviewed - No data to display Imaging Review No results found.  EKG independently reviewed by myself: Sinus bradycardia with a rate of 55, no evidence of ST elevation or ischemia, normal EKG MDM   1. Musculoskeletal chest pain  2. Anxiety     This is a 27 rolled female who presents with chest pain. She is very anxious appearing on exam. Vital signs are reviewed and within normal limits. Patient has reproducible chest wall pain on exam. She is put negative and have low suspicion for PE. EKG is within normal limits. Given her age and low risk factors for ACS, as well as reproducible pain on exam,, workup ACS any further. Patient was  Reassured. She'll be discharged home.  After history, exam, and medical workup I feel the  patient has been appropriately medically screened and is safe for discharge home. Pertinent diagnoses were discussed with the patient. Patient was given return precautions.  Shon Baton, MD 08/27/12 1559  Chest xray reviewed and without abnormality.  Shon Baton, MD 08/27/12 [phone]

## 2012-08-27 NOTE — ED Notes (Signed)
Bed: [facility]03 Expected date:  Expected time:  Means of arrival:  Comments: Possible hip dislocation 

## 2012-08-27 NOTE — Progress Notes (Signed)
[facility] provided patient with a list of primary care resources in [facility]. Patient stated that she was pending Medicaid.

## 2012-08-27 NOTE — ED Notes (Signed)
Pt c/o center chest pain, states hx of panic attacks but the chest pain is causing the panic attack, states "I think something is wrong"; pt denies n/v/SOB

## 2012-09-07 ENCOUNTER — Emergency Department (HOSPITAL_COMMUNITY): Payer: 59

## 2012-09-07 ENCOUNTER — Encounter (HOSPITAL_COMMUNITY): Payer: Self-pay | Admitting: Emergency Medicine

## 2012-09-07 ENCOUNTER — Emergency Department (HOSPITAL_COMMUNITY): Payer: Medicaid Other

## 2012-09-07 ENCOUNTER — Emergency Department (HOSPITAL_COMMUNITY)
Admission: EM | Admit: 2012-09-07 | Discharge: 2012-09-07 | Disposition: A | Discharge status: Home / Self Care | Payer: Medicaid Other | Attending: Emergency Medicine | Admitting: Emergency Medicine

## 2012-09-07 DIAGNOSIS — I3139 Other pericardial effusion (noninflammatory): Secondary | ICD-10-CM

## 2012-09-07 DIAGNOSIS — I319 Disease of pericardium, unspecified: Secondary | ICD-10-CM | POA: Insufficient documentation

## 2012-09-07 DIAGNOSIS — Z3202 Encounter for pregnancy test, result negative: Secondary | ICD-10-CM | POA: Insufficient documentation

## 2012-09-07 DIAGNOSIS — N83209 Unspecified ovarian cyst, unspecified side: Secondary | ICD-10-CM | POA: Insufficient documentation

## 2012-09-07 DIAGNOSIS — F411 Generalized anxiety disorder: Secondary | ICD-10-CM | POA: Insufficient documentation

## 2012-09-07 DIAGNOSIS — R110 Nausea: Secondary | ICD-10-CM | POA: Insufficient documentation

## 2012-09-07 DIAGNOSIS — Z8659 Personal history of other mental and behavioral disorders: Secondary | ICD-10-CM | POA: Insufficient documentation

## 2012-09-07 DIAGNOSIS — Z8619 Personal history of other infectious and parasitic diseases: Secondary | ICD-10-CM | POA: Insufficient documentation

## 2012-09-07 DIAGNOSIS — I313 Pericardial effusion (noninflammatory): Secondary | ICD-10-CM

## 2012-09-07 LAB — URINALYSIS, ROUTINE W REFLEX MICROSCOPIC
Glucose, UA: NEGATIVE mg/dL
Ketones, ur: NEGATIVE mg/dL
Leukocytes, UA: NEGATIVE
Nitrite: NEGATIVE
Protein, ur: NEGATIVE mg/dL
pH: 6.5 (ref 5.0–8.0)

## 2012-09-07 LAB — POCT I-STAT, CHEM 8
BUN: 10 mg/dL (ref 6–23)
Creatinine, Ser: 0.7 mg/dL (ref 0.50–1.10)
Hemoglobin: 11.2 g/dL — ABNORMAL LOW (ref 12.0–15.0)
Potassium: 3.3 mEq/L — ABNORMAL LOW (ref 3.5–5.1)
Sodium: 143 mEq/L (ref 135–145)
TCO2: 21 mmol/L (ref 0–100)

## 2012-09-07 LAB — CBC WITH DIFFERENTIAL/PLATELET
Basophils Absolute: 0 10*3/uL (ref 0.0–0.1)
Basophils Relative: 0 % (ref 0–1)
Eosinophils Relative: 2 % (ref 0–5)
HCT: 30.8 % — ABNORMAL LOW (ref 36.0–46.0)
Hemoglobin: 9.5 g/dL — ABNORMAL LOW (ref 12.0–15.0)
MCH: 23.3 pg — ABNORMAL LOW (ref 26.0–34.0)
MCHC: 30.8 g/dL (ref 30.0–36.0)
MCV: 75.7 fL — ABNORMAL LOW (ref 78.0–100.0)
Monocytes Absolute: 0.4 10*3/uL (ref 0.1–1.0)
Monocytes Relative: 7 % (ref 3–12)
RDW: 18.3 % — ABNORMAL HIGH (ref 11.5–15.5)

## 2012-09-07 LAB — WET PREP, GENITAL: Clue Cells Wet Prep HPF POC: NONE SEEN

## 2012-09-07 LAB — POCT PREGNANCY, URINE: Preg Test, Ur: NEGATIVE

## 2012-09-07 MED ORDER — HYDROCODONE-ACETAMINOPHEN 5-325 MG PO TABS: 1.0000 | ORAL_TABLET | Freq: Four times a day (QID) | ORAL | Status: DC | PRN

## 2012-09-07 MED ORDER — IOHEXOL 300 MG/ML  SOLN
50.0000 mL | Freq: Once | INTRAMUSCULAR | Status: AC | PRN
  Administered 2012-09-07: 50 mL via ORAL

## 2012-09-07 MED ORDER — HYDROMORPHONE HCL PF 1 MG/ML IJ SOLN: 0.5000 mg | Freq: Once | INTRAMUSCULAR | Status: DC

## 2012-09-07 MED ORDER — HYDROMORPHONE HCL PF 1 MG/ML IJ SOLN
1.0000 mg | Freq: Once | INTRAMUSCULAR | Status: AC
  Administered 2012-09-07: 1 mg via INTRAMUSCULAR
  Filled 2012-09-07: qty 1

## 2012-09-07 MED ORDER — HYDROMORPHONE HCL PF 1 MG/ML IJ SOLN
1.0000 mg | Freq: Once | INTRAMUSCULAR | Status: AC
  Administered 2012-09-07: 1 mg via INTRAVENOUS
  Filled 2012-09-07 (×2): qty 1

## 2012-09-07 MED ORDER — ONDANSETRON 4 MG PO TBDP: 4.0000 mg | ORAL_TABLET | Freq: Three times a day (TID) | ORAL | Status: DC | PRN

## 2012-09-07 MED ORDER — IOHEXOL 300 MG/ML  SOLN
100.0000 mL | Freq: Once | INTRAMUSCULAR | Status: AC | PRN
  Administered 2012-09-07: 100 mL via INTRAVENOUS

## 2012-09-07 MED ORDER — HYDROMORPHONE HCL PF 1 MG/ML IJ SOLN
0.5000 mg | Freq: Once | INTRAMUSCULAR | Status: AC
  Administered 2012-09-07: 0.5 mg via INTRAVENOUS

## 2012-09-07 NOTE — ED Provider Notes (Signed)
Medical screening examination/treatment/procedure(s) were performed by non-physician practitioner and as supervising physician I was immediately available for consultation/collaboration.   Shanna Cisco, MD 09/07/12 [phone]

## 2012-09-07 NOTE — ED Notes (Signed)
Patient transported to CT 

## 2012-09-07 NOTE — ED Notes (Signed)
Bed: ZO10 Expected date: 09/07/12 Expected time: 3:01 AM Means of arrival: Ambulance Comments: Bed 21, EMS, 27 F, ABDPain

## 2012-09-07 NOTE — ED Notes (Signed)
PA at bedside.

## 2012-09-07 NOTE — ED Provider Notes (Signed)
6:15 AM Patient signed out to [employer], PA-C.  Patient presents today with RLQ abdominal pain of sudden onset last evening.  Pelvic ultrasound performed, which showed a small dermoid ovarian cyst, but no other findings.  Patient is to have a CT ab/pelvis w/o contrast to determine if a kidney stone is present.  Patient is to be discharged home if CT is negative.  9:11 AM IMPRESSION:  1. Scattered abdominal ascites.  2. Low-density blood pool, query anemia.  3. Pericardial effusion inferiorly.  4. No stones seen.  5. Possible fluid collection, complex cyst, or hydrosalpinx along  the right margin of the uterus.  6. Low position of the IUD in the uterus. The left stem of the IUD  extends through the myometrium essentially to the serosal margin of  the uterus, and may perforate the uterus.  7. The appendix is not visualized. Low negative predictive value of  today's exam for appendicitis.  Dr Rubin Payor discussed with Radiologist.  He recommends getting a CT with contrast for better evaluation.    Dr. Rubin Payor has also discussed with OB/GYN who recommending removing the IUD.  He has removed the IUD.  1:00 PM CT with contrast results as follows: IMPRESSION:  1. The appendix is now identified and is normal.  2. Interval removal of the intrauterine device since the recent  prior CT scan.  3. The addition of intravenous contrast helps identify the right  ovary in the region of the complex fluid collection along the right  margin of the uterus described on the prior CT scan. On the  present study, this appears to be the ovary anteriorly and low  attenuation pelvic ascites posteriorly.  4. Otherwise, no new findings or significant interval change  compared to the CT scan from earlier today.  Patient reports that her symptoms have improved at this time.  Vital signs stable.  Patient stable for discharge. Explained results of the CT scans with the patient.   Patient given referral to  Cardiology for Pericardial effusion, given referral to Gynecology for ovarian cyst, and given referral to PCP.  Strict return precautions given to the patient.   Pascal Lux [place], PA-C 09/08/12 2215

## 2012-09-07 NOTE — ED Provider Notes (Signed)
CSN: [id number]     Arrival date & time 09/07/12  5621 History   First MD Initiated Contact with Patient 09/07/12 [phone]     Chief Complaint  Patient presents with  . Abdominal Pain   (Consider location/radiation/quality/duration/timing/severity/associated sxs/prior Treatment) HPI History provided by pt.  Pt presents w/ gradually worsening, severe, non-radiating RLQ pain since 5pm yesterday.  Associated w/ nausea.  Denies fever, anorexia, diarrhea, and vaginal symptoms.  Saw pink on toilet paper when she wiped after urinating today.  She is concerned that her IUD may have fallen out.  Past surgical history includes two c-sections.  Past Medical History  Diagnosis Date  . Herpes genitalia   . Panic attack   . Trichomonas   . No pertinent past medical history   . Previous cesarean delivery affecting pregnancy 01/10/2011   Past Surgical History  Procedure Laterality Date  . Cesarean section    . Induced abortion    . Cesarean section  01/10/2011    Procedure: CESAREAN SECTION;  Surgeon: Sherron Monday, MD;  Location: [facility];  Service: Gynecology;  Laterality: N/A;  repeat  c/s   Family History  Problem Relation Age of Onset  . Anesthesia problems Neg Hx    History  Substance Use Topics  . Smoking status: Never Smoker   . Smokeless tobacco: Never Used  . Alcohol Use: Yes     Comment: not during preg   OB History   Grav Para Term Preterm Abortions TAB SAB Ect Mult Living   4 2 2  1 1    2      Review of Systems  All other systems reviewed and are negative.    Allergies  Chocolate  Home Medications  No current outpatient prescriptions on file. BP 135/95  Pulse 93  Temp(Src) 97.7 F (36.5 C) (Oral)  Resp 24  SpO2 100%  LMP 08/02/2012 Physical Exam  Nursing note and vitals reviewed. Constitutional: She is oriented to person, place, and time. She appears well-developed and well-nourished.  Anxious and uncomfortable appearing.  Crying hysterically and begging for help.     HENT:  Head: Normocephalic and atraumatic.  Eyes:  Normal appearance  Neck: Normal range of motion.  Cardiovascular: Normal rate and regular rhythm.   Pulmonary/Chest: Effort normal and breath sounds normal. No respiratory distress.  Abdominal: Soft. Bowel sounds are normal. She exhibits no distension and no mass. There is no rebound and no guarding.  RLQ ttp w/ guarding.  Mild suprapubic tenderness.   Genitourinary:  No CVA tenderness.  Nml external genitalia.  Moderate amt thin, white vaginal discharge.  Cervix closed and IUD strings in place.   Severe R adnexal and mild cervical motion tenderness.    Musculoskeletal: Normal range of motion.  Neurological: She is alert and oriented to person, place, and time.  Skin: Skin is warm and dry. No rash noted.  Psychiatric: She has a normal mood and affect. Her behavior is normal.    ED Course  Procedures (including critical care time) Labs Review Labs Reviewed  WET PREP, GENITAL - Abnormal; Notable for the following:    WBC, Wet Prep HPF POC FEW (*)    All other components within normal limits  CBC WITH DIFFERENTIAL - Abnormal; Notable for the following:    Hemoglobin 9.5 (*)    HCT 30.8 (*)    MCV 75.7 (*)    MCH 23.3 (*)    RDW 18.3 (*)    All other components within normal limits  POCT I-STAT, CHEM 8 - Abnormal; Notable for the following:    Potassium 3.3 (*)    Hemoglobin 11.2 (*)    HCT 33.0 (*)    All other components within normal limits  GC/CHLAMYDIA PROBE AMP  URINALYSIS, ROUTINE W REFLEX MICROSCOPIC  POCT PREGNANCY, URINE   Imaging Review US Transvaginal Non-ob  09/07/2012   *RADIOLOGY REPORT*  Clinical Data:  Severe right lower quadrant pain.  Intrauterine device.  TRANSABDOMINAL AND TRANSVAGINAL ULTRASOUND OF PELVIS DOPPLER ULTRASOUND OF OVARIES  Technique:  Both transabdominal and transvaginal ultrasound examinations of the pelvis were performed. Transabdominal technique was performed for global imaging of the  pelvis including uterus, ovaries, adnexal regions, and pelvic cul-de-sac.  It was necessary to proceed with endovaginal exam following the transabdominal exam to visualize the ovaries.  Color and duplex Doppler ultrasound was utilized to evaluate blood flow to the ovaries.  Comparison:  None.  FINDINGS  Uterus:  Uterus is anteverted and measures 10.8 x 5.2 x 5.2 cm. Foreign body consistent with intrauterine device.  Intrauterine device appears to be in the lower uterine segment.  No myometrial masses.  Small Nabothian cysts in the cervix.  Endometrium:  Normal endometrial stripe thickness of 8 mm.  No abnormal endometrial fluid collections.  Right ovary: Right ovary measures 5.1 x 3.7 x 3.7 cm.  Focal circumscribed hyperechoic structure in the right ovary measuring about 2 cm maximal diameter.  This may represent a fat-containing dermoid cyst.  No other structural components identified.  Flow is demonstrated in the right ovary on color flow Doppler imaging.  Left ovary: Left ovary measures 1.6 x 1.5 x 1.5 cm.  Follicular cystic changes are suggested.  Flow is demonstrated in the left ovary on color flow Doppler imaging.  Small amount of free fluid in the pelvis.  Pulsed Doppler evaluation demonstrates normal low-resistance arterial and venous waveforms in both ovaries.  IMPRESSION:  Intrauterine device appears to be in the lower uterine segment. The uterus and endometrium otherwise unremarkable.  Echogenic focus in the right ovary may represent a dermoid cyst.  Small amount of free fluid in the pelvis is likely to be physiologic.  No sonographic evidence for ovarian torsion.   Original Report Authenticated By: Burman Nieves, M.D.   US Pelvis Complete  09/07/2012   *RADIOLOGY REPORT*  Clinical Data:  Severe right lower quadrant pain.  Intrauterine device.  TRANSABDOMINAL AND TRANSVAGINAL ULTRASOUND OF PELVIS DOPPLER ULTRASOUND OF OVARIES  Technique:  Both transabdominal and transvaginal ultrasound examinations of  the pelvis were performed. Transabdominal technique was performed for global imaging of the pelvis including uterus, ovaries, adnexal regions, and pelvic cul-de-sac.  It was necessary to proceed with endovaginal exam following the transabdominal exam to visualize the ovaries.  Color and duplex Doppler ultrasound was utilized to evaluate blood flow to the ovaries.  Comparison:  None.  FINDINGS  Uterus:  Uterus is anteverted and measures 10.8 x 5.2 x 5.2 cm. Foreign body consistent with intrauterine device.  Intrauterine device appears to be in the lower uterine segment.  No myometrial masses.  Small Nabothian cysts in the cervix.  Endometrium:  Normal endometrial stripe thickness of 8 mm.  No abnormal endometrial fluid collections.  Right ovary: Right ovary measures 5.1 x 3.7 x 3.7 cm.  Focal circumscribed hyperechoic structure in the right ovary measuring about 2 cm maximal diameter.  This may represent a fat-containing dermoid cyst.  No other structural components identified.  Flow is demonstrated in the right ovary on color flow  Doppler imaging.  Left ovary: Left ovary measures 1.6 x 1.5 x 1.5 cm.  Follicular cystic changes are suggested.  Flow is demonstrated in the left ovary on color flow Doppler imaging.  Small amount of free fluid in the pelvis.  Pulsed Doppler evaluation demonstrates normal low-resistance arterial and venous waveforms in both ovaries.  IMPRESSION:  Intrauterine device appears to be in the lower uterine segment. The uterus and endometrium otherwise unremarkable.  Echogenic focus in the right ovary may represent a dermoid cyst.  Small amount of free fluid in the pelvis is likely to be physiologic.  No sonographic evidence for ovarian torsion.   Original Report Authenticated By: Burman Nieves, M.D.   [place] Art/ven Flow Abd Pelv Doppler  09/07/2012   *RADIOLOGY REPORT*  Clinical Data:  Severe right lower quadrant pain.  Intrauterine device.  TRANSABDOMINAL AND TRANSVAGINAL ULTRASOUND OF PELVIS  DOPPLER ULTRASOUND OF OVARIES  Technique:  Both transabdominal and transvaginal ultrasound examinations of the pelvis were performed. Transabdominal technique was performed for global imaging of the pelvis including uterus, ovaries, adnexal regions, and pelvic cul-de-sac.  It was necessary to proceed with endovaginal exam following the transabdominal exam to visualize the ovaries.  Color and duplex Doppler ultrasound was utilized to evaluate blood flow to the ovaries.  Comparison:  None.  FINDINGS  Uterus:  Uterus is anteverted and measures 10.8 x 5.2 x 5.2 cm. Foreign body consistent with intrauterine device.  Intrauterine device appears to be in the lower uterine segment.  No myometrial masses.  Small Nabothian cysts in the cervix.  Endometrium:  Normal endometrial stripe thickness of 8 mm.  No abnormal endometrial fluid collections.  Right ovary: Right ovary measures 5.1 x 3.7 x 3.7 cm.  Focal circumscribed hyperechoic structure in the right ovary measuring about 2 cm maximal diameter.  This may represent a fat-containing dermoid cyst.  No other structural components identified.  Flow is demonstrated in the right ovary on color flow Doppler imaging.  Left ovary: Left ovary measures 1.6 x 1.5 x 1.5 cm.  Follicular cystic changes are suggested.  Flow is demonstrated in the left ovary on color flow Doppler imaging.  Small amount of free fluid in the pelvis.  Pulsed Doppler evaluation demonstrates normal low-resistance arterial and venous waveforms in both ovaries.  IMPRESSION:  Intrauterine device appears to be in the lower uterine segment. The uterus and endometrium otherwise unremarkable.  Echogenic focus in the right ovary may represent a dermoid cyst.  Small amount of free fluid in the pelvis is likely to be physiologic.  No sonographic evidence for ovarian torsion.   Original Report Authenticated By: Burman Nieves, M.D.    MDM  No diagnosis found. 27yo F presents w/ severe RLQ pain.  On initial exam, pt  writhing in pain, crying, anxious appearing, abd soft/non-distended, RLQ ttp w/ guarding.  Nursing staff are starting an IV but 1mg  IM dilaudid ordered for pain in meantime.  Will obtain urine preg and then examine pelvis. 3:32 AM   Pelvic exam sig for severe R adnexal tenderness.  Pregnancy test negative.  Transvaginal US ordered.  Pt to receive another dose of dilaudid.  4:36 AM   No significant findings on [place]; no evidence of torsion.  Results discussed w/ pt.  Her pain is mildly improved.  U/A was not ordered initially in error and is now pending, but based on initial presentation and history of possible hematuria, will CT w/out contrast to r/o nephrolithiasis.  Appendicitis less likely based on abrupt onset  of pain and lack of N/V and anorexia.  Laisure, PA-C to resume care.  5:46 AM       Otilio Miu, PA-C 09/07/12 [phone]

## 2012-09-07 NOTE — ED Notes (Signed)
Notified RN, Hardie Lora, pt. i-stat Chem 8 results potassium 3.3 and hemoglobin 11.2.

## 2012-09-07 NOTE — ED Notes (Signed)
Per EMS, pt. From home with complaint of abdominal pain at 10/10 which started 5pm yesterday and it gotten worst this evening. Denies N/V nor diarrhea. Pt. Crying and very anxious. Also reported of having an IUD and she thinks that "it might got dislodged".

## 2012-09-07 NOTE — ED Notes (Signed)
MD at bedside. 

## 2012-09-07 NOTE — ED Notes (Signed)
Pt escorted to discharge window. Verbalized understanding discharge instructions. In no acute distress.   

## 2012-09-09 LAB — GC/CHLAMYDIA PROBE AMP: GC Probe RNA: NEGATIVE

## 2012-09-11 NOTE — ED Provider Notes (Signed)
Medical screening examination/treatment/procedure(s) were performed by non-physician practitioner and as supervising physician I was immediately available for consultation/collaboration.  Fradel Baldonado R. Eugune Sine, MD 09/11/12 1532 

## 2012-10-16 ENCOUNTER — Encounter (HOSPITAL_COMMUNITY): Payer: Self-pay | Admitting: Emergency Medicine

## 2012-10-16 ENCOUNTER — Emergency Department (HOSPITAL_COMMUNITY)
Admission: EM | Admit: 2012-10-16 | Discharge: 2012-10-17 | Disposition: A | Discharge status: Home / Self Care | Payer: Self-pay | Attending: Emergency Medicine | Admitting: Emergency Medicine

## 2012-10-16 ENCOUNTER — Emergency Department (HOSPITAL_COMMUNITY): Payer: 59

## 2012-10-16 DIAGNOSIS — Z8619 Personal history of other infectious and parasitic diseases: Secondary | ICD-10-CM | POA: Insufficient documentation

## 2012-10-16 DIAGNOSIS — N920 Excessive and frequent menstruation with regular cycle: Secondary | ICD-10-CM | POA: Insufficient documentation

## 2012-10-16 DIAGNOSIS — Z3202 Encounter for pregnancy test, result negative: Secondary | ICD-10-CM | POA: Insufficient documentation

## 2012-10-16 DIAGNOSIS — Z9889 Other specified postprocedural states: Secondary | ICD-10-CM | POA: Insufficient documentation

## 2012-10-16 LAB — BASIC METABOLIC PANEL
CO2: 24 mEq/L (ref 19–32)
Chloride: 105 mEq/L (ref 96–112)
GFR calc Af Amer: >90 mL/min (ref >90)
Potassium: 3.8 mEq/L (ref 3.5–5.1)
Sodium: 136 mEq/L (ref 135–145)

## 2012-10-16 LAB — CBC WITH DIFFERENTIAL/PLATELET
Basophils Absolute: 0 10*3/uL (ref 0.0–0.1)
Basophils Relative: 0 % (ref 0–1)
Lymphocytes Relative: 45 % (ref 12–46)
MCHC: 31.6 g/dL (ref 30.0–36.0)
Neutro Abs: 2.7 10*3/uL (ref 1.7–7.7)
Neutrophils Relative %: 47 % (ref 43–77)
RDW: 18.2 % — ABNORMAL HIGH (ref 11.5–15.5)
WBC: 5.9 10*3/uL (ref 4.0–10.5)

## 2012-10-16 LAB — POCT PREGNANCY, URINE: Preg Test, Ur: NEGATIVE

## 2012-10-16 LAB — URINE MICROSCOPIC-ADD ON

## 2012-10-16 LAB — URINALYSIS, ROUTINE W REFLEX MICROSCOPIC
Ketones, ur: NEGATIVE mg/dL
Nitrite: NEGATIVE
Specific Gravity, Urine: 1.031 — ABNORMAL HIGH (ref 1.005–1.030)
pH: 6 (ref 5.0–8.0)

## 2012-10-16 LAB — HCG, QUANTITATIVE, PREGNANCY: hCG, Beta Chain, Quant, S: <1 m[IU]/mL (ref <5)

## 2012-10-16 MED ORDER — MORPHINE SULFATE 4 MG/ML IJ SOLN
4.0000 mg | Freq: Once | INTRAMUSCULAR | Status: AC
  Administered 2012-10-16: 4 mg via INTRAVENOUS
  Filled 2012-10-16: qty 1

## 2012-10-16 MED ORDER — SODIUM CHLORIDE 0.9 % IV BOLUS (SEPSIS)
1000.0000 mL | Freq: Once | INTRAVENOUS | Status: AC
  Administered 2012-10-16: 1000 mL via INTRAVENOUS

## 2012-10-16 MED ORDER — KETOROLAC TROMETHAMINE 30 MG/ML IJ SOLN
30.0000 mg | Freq: Once | INTRAMUSCULAR | Status: AC
  Administered 2012-10-16: 30 mg via INTRAVENOUS
  Filled 2012-10-16: qty 1

## 2012-10-16 MED ORDER — HYDROCODONE-ACETAMINOPHEN 5-325 MG PO TABS
1.0000 | ORAL_TABLET | Freq: Once | ORAL | Status: AC
  Administered 2012-10-16: 1 via ORAL
  Filled 2012-10-16: qty 1

## 2012-10-16 NOTE — ED Notes (Signed)
LMP 08/02/2012 Patient states "So this is my first period since I had the IUD removed a month ago." Patient asking for an orange soda--patient informed of NPO status until seen by ED provider--v/u

## 2012-10-16 NOTE — ED Notes (Signed)
Patient with c/o vaginal bleeding x 3 days Patient denies possibility of pregnancy Patient had IUD removed a month ago due to device "piercing my uterine wall." Patient states that she has gone through six pads today  Patient appears in NAD

## 2012-10-16 NOTE — ED Provider Notes (Signed)
CSN: [id number]     Arrival date & time 10/16/12  2107 History   First MD Initiated Contact with Patient 10/16/12 2203     Chief Complaint  Patient presents with  . Vaginal Bleeding   (Consider location/radiation/quality/duration/timing/severity/associated sxs/prior Treatment) HPI  This is a 27 rolled female who presents with 3 days of vaginal bleeding. Patient reports having her IUD removed approximately one month ago because "it was piercing my uterine wall."  Patient reports onset of vaginal bleeding on Monday. She states that she is taking birth control and is currently on her active pills although she did stop these after she started bleeding on Monday. Patient reports going through 2 pads per hour. Patient also reports crampy abdominal pain that is nonradiating. She reports her pain is 6/10. Patient states she does not think she could be pregnant. She denies any symptoms including nausea, vomiting, urinary symptoms.  Past Medical History  Diagnosis Date  . Herpes genitalia   . Panic attack   . Trichomonas   . No pertinent past medical history   . Previous cesarean delivery affecting pregnancy 01/10/2011   Past Surgical History  Procedure Laterality Date  . Cesarean section    . Induced abortion    . Cesarean section  01/10/2011    Procedure: CESAREAN SECTION;  Surgeon: Sherron Monday, MD;  Location: [facility];  Service: Gynecology;  Laterality: N/A;  repeat  c/s   Family History  Problem Relation Age of Onset  . Anesthesia problems Neg Hx    History  Substance Use Topics  . Smoking status: Never Smoker   . Smokeless tobacco: Never Used  . Alcohol Use: Yes     Comment: not during preg   OB History   Grav Para Term Preterm Abortions TAB SAB Ect Mult Living   4 2 2  1 1    2      Review of Systems  Constitutional: Negative for fever.  Respiratory: Negative for cough, chest tightness and shortness of breath.   Cardiovascular: Negative for chest pain.  Gastrointestinal: Positive  for abdominal pain. Negative for nausea and vomiting.  Genitourinary: Positive for vaginal bleeding.  All other systems reviewed and are negative.    Allergies  Chocolate  Home Medications   Current Outpatient Rx  Name  Route  Sig  Dispense  Refill  . levonorgestrel-ethinyl estradiol (AVIANE,ALESSE,LESSINA) 0.1-20 MG-MCG tablet   Oral   Take 1 tablet by mouth daily.         Marland Kitchen HYDROcodone-acetaminophen (NORCO/VICODIN) 5-325 MG per tablet   Oral   Take 1 tablet by mouth every 6 (six) hours as needed for pain.   5 tablet   0   . ibuprofen (ADVIL,MOTRIN) 600 MG tablet   Oral   Take 1 tablet (600 mg total) by mouth every 6 (six) hours as needed for pain.   30 tablet   0    BP 120/58  Pulse 57  Temp(Src) 98.4 F (36.9 C) (Oral)  Resp 20  SpO2 99%  LMP 08/02/2012  Breastfeeding? No Physical Exam  Nursing note and vitals reviewed. Constitutional: She is oriented to person, place, and time. She appears well-developed and well-nourished. No distress.  HENT:  Head: Normocephalic and atraumatic.  Eyes: Pupils are equal, round, and reactive to light.  Neck: Neck supple.  Cardiovascular: Normal rate, regular rhythm and normal heart sounds.   No murmur heard. Pulmonary/Chest: Effort normal and breath sounds normal. No respiratory distress. She has no wheezes.  Abdominal: Soft.  Bowel sounds are normal. There is no tenderness. There is no rebound and no guarding.  Genitourinary:  Large amount of fresh pulled blood in the vaginal vault, clot noted at the cervical os, no lateralizing pain, no cervical motion tenderness  Neurological: She is alert and oriented to person, place, and time.  Skin: Skin is warm and dry.  Psychiatric: She has a normal mood and affect.    ED Course  Procedures (including critical care time) Labs Review Labs Reviewed  CBC WITH DIFFERENTIAL - Abnormal; Notable for the following:    RBC 3.72 (*)    Hemoglobin 8.9 (*)    HCT 28.2 (*)    MCV 75.8  (*)    MCH 23.9 (*)    RDW 18.2 (*)    All other components within normal limits  URINALYSIS, ROUTINE W REFLEX MICROSCOPIC - Abnormal; Notable for the following:    APPearance CLOUDY (*)    Specific Gravity, Urine 1.031 (*)    Hgb urine dipstick LARGE (*)    Leukocytes, UA TRACE (*)    All other components within normal limits  URINE MICROSCOPIC-ADD ON - Abnormal; Notable for the following:    Bacteria, UA FEW (*)    All other components within normal limits  GC/CHLAMYDIA PROBE AMP  BASIC METABOLIC PANEL  HCG, QUANTITATIVE, PREGNANCY  POCT PREGNANCY, URINE   Imaging Review US Pelvis Complete  10/17/2012   CLINICAL DATA:  Vaginal bleeding  EXAM: TRANSABDOMINAL ULTRASOUND OF PELVIS  TECHNIQUE: Transabdominal ultrasound examination of the pelvis was performed including evaluation of the uterus, ovaries, adnexal regions, and pelvic cul-de-sac.  COMPARISON:  Abdominal CT 09/07/2012. Pelvic ultrasound 09/07/2012.  FINDINGS: Uterus  Measurements: 11 x 5 x 6 cm. Mineralization and distortion of the lower anterior uterine segment presumably related to previous cesarean section. No fibroids or other mass visualized.  Endometrium  Thickness: 5 mm.  No focal abnormality visualized.  Right ovary  Measurements: 2.9 x 2.2 x 2.4 cm. Normal appearance/no adnexal mass.  Left ovary  Measurements: 3 x 1.9 x 2.3 cm. Normal appearance/no adnexal mass.  Other findings:  No free fluid  IMPRESSION: Negative pelvic ultrasound.   Electronically Signed   By: Tiburcio Pea M.D.   On: 10/17/2012 00:41    EKG Interpretation   None       MDM   1. Menorrhagia    Patient presents with menorrhagia for the last 3 days. She is nontoxic-appearing on exam. Vital signs are within normal limits. She has no evidence of orthostasis and denies dizziness.  Her hemoglobin has dropped since 2.3 in the last month. Patient has a large amount of bright blood in her vaginal vault consistent with her history. Ultrasound is negative  for any continued endometrial abnormality. Patient has followup with her OB. She was encouraged to restart her birth control pills as this is likely to help with her bleeding. She was given strict return precautions and bleeding precautions including bleeding more than 2 pads an hour.  After history, exam, and medical workup I feel the patient has been appropriately medically screened and is safe for discharge home. Pertinent diagnoses were discussed with the patient. Patient was given return precautions.   Shon Baton, MD 10/18/12 [phone]

## 2012-10-17 LAB — GC/CHLAMYDIA PROBE AMP: GC Probe RNA: NEGATIVE

## 2012-10-17 MED ORDER — IBUPROFEN 600 MG PO TABS: 600.0000 mg | ORAL_TABLET | Freq: Four times a day (QID) | ORAL | Status: DC | PRN

## 2012-10-17 MED ORDER — HYDROCODONE-ACETAMINOPHEN 5-325 MG PO TABS: 1.0000 | ORAL_TABLET | Freq: Four times a day (QID) | ORAL | Status: DC | PRN

## 2012-11-04 ENCOUNTER — Ambulatory Visit (INDEPENDENT_AMBULATORY_CARE_PROVIDER_SITE_OTHER): Payer: Managed Care, Other (non HMO) | Admitting: Cardiology

## 2012-11-04 ENCOUNTER — Other Ambulatory Visit: Payer: Self-pay | Admitting: *Deleted

## 2012-11-04 ENCOUNTER — Encounter: Payer: Self-pay | Admitting: Cardiology

## 2012-11-04 VITALS — BP 100/62 | HR 60 | Ht 70.0 in | Wt 200.0 lb

## 2012-11-04 DIAGNOSIS — I313 Pericardial effusion (noninflammatory): Secondary | ICD-10-CM

## 2012-11-04 DIAGNOSIS — I319 Disease of pericardium, unspecified: Secondary | ICD-10-CM

## 2012-11-04 DIAGNOSIS — I3139 Other pericardial effusion (noninflammatory): Secondary | ICD-10-CM

## 2012-11-04 DIAGNOSIS — R079 Chest pain, unspecified: Secondary | ICD-10-CM | POA: Insufficient documentation

## 2012-11-04 MED ORDER — COLCHICINE 0.6 MG PO TABS: 0.6000 mg | ORAL_TABLET | Freq: Two times a day (BID) | ORAL | Status: DC

## 2012-11-04 MED ORDER — FERROUS SULFATE 325 (65 FE) MG PO TABS: 325.0000 mg | ORAL_TABLET | Freq: Two times a day (BID) | ORAL | Status: DC

## 2012-11-04 NOTE — Patient Instructions (Addendum)
Your physician has recommended you make the following change in your medication:   1.Start Colchicine 0.6 mg twice daily  2. Start Ibuprofen 400 mg twice daily  3. Start Iron 325 mg twice daily  Your physician has requested that you have an echocardiogram. Echocardiography is a painless test that uses sound waves to create images of your heart. It provides your doctor with information about the size and shape of your heart and how well your heart's chambers and valves are working. This procedure takes approximately one hour. There are no restrictions for this procedure.  Your physician recommends that you schedule a follow-up appointment in: to to 3 weeks with Dr. Delton See

## 2012-11-04 NOTE — Progress Notes (Signed)
Patient ID: SHAUNTAE REITMAN, female   DOB: 25-Sep-1985, 27 y.o.   MRN: [id number]    Patient Name: Alison Gomez Date of Encounter: 11/04/2012  Primary Care Provider:  No primary provider on file. Primary Cardiologist: Tobias Alexander, H  Patient Profile  Pericardial effusion on CT  Problem List   Past Medical History  Diagnosis Date  . Herpes genitalia   . Panic attack   . Trichomonas   . No pertinent past medical history   . Previous cesarean delivery affecting pregnancy 01/10/2011   Past Surgical History  Procedure Laterality Date  . Cesarean section    . Induced abortion    . Cesarean section  01/10/2011    Procedure: CESAREAN SECTION;  Surgeon: Sherron Monday, MD;  Location: [facility];  Service: Gynecology;  Laterality: N/A;  repeat  c/s    Allergies  Allergies  Allergen Reactions  . Chocolate Anaphylaxis    HPI  This is a very pleasant 27 year old female who was recently seen in the ER for profound vaginal bleeding that was followed after removal of her IUD because of vaginal perforation. As a part of bleeding workup abdominal CT was performed with limited view of heart. A small amount of per cardial effusion was described. The patient states that for the last couple of months she has experienced chest pain, it is non-exertional, it can happen anytime of the day, and is constant, radiating to her back. The pain feels sharp retrosternal, and changes with the position. The patient also states that she has history of anxiety, and then she had episodes of chest pain she also starts hyperventilating and eventually feel short of breath. No palpitations no dizziness, and no syncopal episode. The patient doesn't remember any viral-like illness prior to starting these chest pain episodes. No orthopnea no paroxysmal nocturnal dyspnea.  Home Medications  Prior to Admission medications   Medication Sig Start Date End Date Taking? Authorizing Provider  HYDROcodone-acetaminophen  (NORCO/VICODIN) 5-325 MG per tablet Take 1 tablet by mouth every 6 (six) hours as needed for pain. 10/17/12   Shon Baton, MD  ibuprofen (ADVIL,MOTRIN) 600 MG tablet Take 1 tablet (600 mg total) by mouth every 6 (six) hours as needed for pain. 10/17/12   Shon Baton, MD  levonorgestrel-ethinyl estradiol (AVIANE,ALESSE,LESSINA) 0.1-20 MG-MCG tablet Take 1 tablet by mouth daily.    Historical Provider, MD    Family History  Family History  Problem Relation Age of Onset  . Anesthesia problems Neg Hx     Social History  History   Social History  . Marital Status: Single    Spouse Name: N/A    Number of Children: N/A  . Years of Education: N/A   Occupational History  . Not on file.   Social History Main Topics  . Smoking status: Never Smoker   . Smokeless tobacco: Never Used  . Alcohol Use: Yes     Comment: not during preg  . Drug Use: No  . Sexual Activity: Yes   Other Topics Concern  . Not on file   Social History Narrative  . No narrative on file     Review of Systems, as per history of present illness otherwise negative General:  No chills, fever, night sweats or weight changes.  Cardiovascular:  No chest pain, dyspnea on exertion, edema, orthopnea, palpitations, paroxysmal nocturnal dyspnea. Dermatological: No rash, lesions/masses Respiratory: No cough, dyspnea Urologic: No hematuria, dysuria Abdominal:   No nausea, vomiting, diarrhea, bright red blood per  rectum, melena, or hematemesis Neurologic:  No visual changes, wkns, changes in mental status. All other systems reviewed and are otherwise negative except as noted above.  Physical Exam  Last menstrual period 08/02/2012, not currently breastfeeding.  General: Pleasant, NAD Psych: Normal affect. Neuro: Alert and oriented X 3. Moves all extremities spontaneously. HEENT: Normal  Neck: Supple without bruits or JVD. Lungs:  Resp regular and unlabored, CTA. Heart: RRR no s3, s4, or murmurs. No  rub Abdomen: Soft, non-tender, non-distended, BS + x 4.  Extremities: No clubbing, cyanosis or edema. DP/PT/Radials 2+ and equal bilaterally.  Accessory Clinical Findings  ECG - sinus rhythm with sinus arrhythmia, 60 beats per minute, no ST-T wave abnormalities, diffuse ST elevations predominantly in the inferolateral leads.   Assessment & Plan  27 year old female  1. Atypical chest pain - nonexertional, positional, possibly pericarditis. There is no rub on physical exam, but there were diffuse ST elevations on her EKG. We will start ibuprofen 400 mg twice a day, the patient is advised that if she starts bleeding she should discontinue immediately. We will also start colchicine 0.6 mg twice a day. I personally reviewed CT scan, because it caused abdominal scan there is very limited view of the inferior portion of the heart with minimal pericardial effusion. We will order a transthoracic echocardiogram to assess pericardial effusion.  2. Microcytic anemia - we will start patient on iron sulfate 325 mg orally twice a day, she was advised to use over-the-counter stool softeners to prevent constipation.  Followup in 3 weeks  Tobias Alexander, Rexene Edison, MD 11/04/2012, 10:18 AM

## 2012-11-19 ENCOUNTER — Other Ambulatory Visit (HOSPITAL_COMMUNITY): Payer: Managed Care, Other (non HMO)

## 2012-11-19 ENCOUNTER — Telehealth: Payer: Self-pay | Admitting: Cardiology

## 2012-11-19 NOTE — Telephone Encounter (Signed)
**Note De-Identified  Obfuscation** At last OV with Dr Delton See on 11/3 the pt was advised to start taking Colchicine 0.6 mg twice daily, Ibuprofen 400 mg twice daily,and Iron 325 mg twice daily. Please advise.

## 2012-11-19 NOTE — Telephone Encounter (Signed)
New Problem:  Pt states she wants to speak to the doctor about other alternatives for medication. Pt states her medication was $300+... Please advise pt on other options.

## 2012-11-19 NOTE — Telephone Encounter (Signed)
LMTCB

## 2012-11-19 NOTE — Telephone Encounter (Signed)
Alison Gomez, I just found out that it is Colchicine that is so expensive. Tell her to just use high doses of Ibuprofen 400 mg 3 x daily until she sees me the next time. She should also use FeS. Thank you, Aris Lot

## 2012-11-25 NOTE — Telephone Encounter (Signed)
**Note De-Identified  Obfuscation** Attempting to reach pt. and have left several messages. She called and canceled Echo and her f/u appt with Dr Delton See.

## 2012-11-26 ENCOUNTER — Ambulatory Visit: Payer: Managed Care, Other (non HMO) | Admitting: Cardiology

## 2012-12-09 ENCOUNTER — Telehealth: Payer: Self-pay | Admitting: Cardiology

## 2012-12-09 ENCOUNTER — Telehealth: Payer: Self-pay | Admitting: *Deleted

## 2012-12-09 NOTE — Telephone Encounter (Signed)
See previous note from 12/8 - Shela Nevin, RN

## 2012-12-09 NOTE — Telephone Encounter (Signed)
Return phone call from patient regarding cost of medication ordered at last MD visit. Dr. Delton See advises for patient to take Ibuprofen 400mg  three times per day and to schedule an appointment this month. Patient had previously given permission to leave voice message if she was unable to answer the phone since she had to go to work. Left message with instructions and stated that Triage nurse can call patient back tomorrow to schedule MD appointment. Routed to triage.

## 2012-12-09 NOTE — Telephone Encounter (Signed)
New problem   Pt called still having chest pain, and would like a med she can afford please.

## 2012-12-10 NOTE — Telephone Encounter (Signed)
LMTCB @1124am /PE (and left detailed message, per pt request from yesterday, regarding MD instructions/advisement regarding Ibuprofen 400mg  by mouth three times per day and need to be seen in appointment again).

## 2012-12-24 ENCOUNTER — Encounter: Payer: Self-pay | Admitting: *Deleted

## 2012-12-24 ENCOUNTER — Other Ambulatory Visit: Payer: Self-pay | Admitting: *Deleted

## 2013-01-01 ENCOUNTER — Encounter: Payer: Self-pay | Admitting: Cardiology

## 2013-01-01 ENCOUNTER — Ambulatory Visit (INDEPENDENT_AMBULATORY_CARE_PROVIDER_SITE_OTHER): Payer: Managed Care, Other (non HMO) | Admitting: Cardiology

## 2013-01-01 VITALS — BP 116/72 | HR 82 | Ht 70.0 in | Wt 197.0 lb

## 2013-01-01 DIAGNOSIS — I309 Acute pericarditis, unspecified: Secondary | ICD-10-CM

## 2013-01-01 MED ORDER — IBUPROFEN 200 MG PO TABS: ORAL_TABLET | ORAL | Status: DC

## 2013-01-01 NOTE — Progress Notes (Signed)
Patient ID: [name], female   DOB: 07-24-1985, 27 y.o.   MRN: [id number]    Patient Name: Alison Gomez Date of Encounter: 01/01/2013  Primary Care Provider:  No primary provider on file. Primary Cardiologist: Tobias Alexander, H  Patient Profile  Pericardial effusion on CT  Problem List   Past Medical History  Diagnosis Date  . Herpes genitalia   . Panic attack   . Trichomonas   . No pertinent past medical history   . Previous cesarean delivery affecting pregnancy 01/10/2011  . Chest pain    Past Surgical History  Procedure Laterality Date  . Cesarean section    . Induced abortion    . Cesarean section  01/10/2011    Procedure: CESAREAN SECTION;  Surgeon: Sherron Monday, MD;  Location: [facility];  Service: Gynecology;  Laterality: N/A;  repeat  c/s    Allergies  Allergies  Allergen Reactions  . Chocolate Anaphylaxis   HPI  This is a very pleasant 27 year old female who was recently seen in the ER for profound vaginal bleeding that was followed after removal of her IUD because of vaginal perforation. As a part of bleeding workup abdominal CT was performed with limited view of heart. A small amount of per cardial effusion was described. The patient states that for the last couple of months she has experienced chest pain, it is non-exertional, it can happen anytime of the day, and is constant, radiating to her back. The pain feels sharp retrosternal, and changes with the position. The patient also states that she has history of anxiety, and then she had episodes of chest pain she also starts hyperventilating and eventually feel short of breath. No palpitations no dizziness, and no syncopal episode. The patient doesn't remember any viral-like illness prior to starting these chest pain episodes. No orthopnea no paroxysmal nocturnal dyspnea.  This is a 1 month follow up, the patient couldn't afford Colchicine (200$) and didn't use ibuprofen as she doesn't like to take pain  killers. She cancelled echo as she didn't feel it was necessary since she didn't follow treatment. She continues to have the same chest pain.  Home Medications  Prior to Admission medications   Medication Sig Start Date End Date Taking? Authorizing Provider  HYDROcodone-acetaminophen (NORCO/VICODIN) 5-325 MG per tablet Take 1 tablet by mouth every 6 (six) hours as needed for pain. 10/17/12   Shon Baton, MD  ibuprofen (ADVIL,MOTRIN) 600 MG tablet Take 1 tablet (600 mg total) by mouth every 6 (six) hours as needed for pain. 10/17/12   Shon Baton, MD  levonorgestrel-ethinyl estradiol (AVIANE,ALESSE,LESSINA) 0.1-20 MG-MCG tablet Take 1 tablet by mouth daily.    Historical Provider, MD    Family History  Family History  Problem Relation Age of Onset  . Anesthesia problems Neg Hx     Social History  History   Social History  . Marital Status: Single    Spouse Name: N/A    Number of Children: N/A  . Years of Education: N/A   Occupational History  . Not on file.   Social History Main Topics  . Smoking status: Never Smoker   . Smokeless tobacco: Never Used  . Alcohol Use: Yes     Comment: not during preg  . Drug Use: No  . Sexual Activity: Yes   Other Topics Concern  . Not on file   Social History Narrative  . No narrative on file     Review of Systems, as per history  of present illness otherwise negative General:  No chills, fever, night sweats or weight changes.  Cardiovascular:  No chest pain, dyspnea on exertion, edema, orthopnea, palpitations, paroxysmal nocturnal dyspnea. Dermatological: No rash, lesions/masses Respiratory: No cough, dyspnea Urologic: No hematuria, dysuria Abdominal:   No nausea, vomiting, diarrhea, bright red blood per rectum, melena, or hematemesis Neurologic:  No visual changes, wkns, changes in mental status. All other systems reviewed and are otherwise negative except as noted above.  Physical Exam  There were no vitals taken  for this visit.  General: Pleasant, NAD Psych: Normal affect. Neuro: Alert and oriented X 3. Moves all extremities spontaneously. HEENT: Normal  Neck: Supple without bruits or JVD. Lungs:  Resp regular and unlabored, CTA. Heart: RRR no s3, s4, or murmurs. + rub systolic and diastolic rub Abdomen: Soft, non-tender, non-distended, BS + x 4.  Extremities: No clubbing, cyanosis or edema. DP/PT/Radials 2+ and equal bilaterally.  Accessory Clinical Findings  ECG - sinus rhythm with sinus arrhythmia, 60 beats per minute, no ST-T wave abnormalities, diffuse ST elevations predominantly in the inferolateral leads.   Assessment & Plan  27 year old female  1. Atypical chest pain - nonexertional, positional, most probably pericarditis, there is rub. We will restart Ibuprofen 400 mg po TID x 10 days and follow for symptoms improvement. We will reschedule the echocardiogram to evaluate for pericardial effusion. I personally reviewed CT scan, because it caused abdominal scan there is very limited view of the inferior portion of the heart with minimal pericardial effusion.   2. Microcytic anemia - we will start patient on iron sulfate 325 mg orally twice a day, she was advised to use over-the-counter stool softeners to prevent constipation.  Followup in 2 weeks  Tobias Alexander, Rexene Edison, MD 01/01/2013, 9:46 AM

## 2013-01-01 NOTE — Patient Instructions (Signed)
Take Ibuprofen 400mg  three times a day for 10 days  We will reschedule your ECHO   Follow-up with Dr. Delton See in 2 weeks after you have had your ECHO

## 2013-01-15 ENCOUNTER — Encounter: Payer: Self-pay | Admitting: *Deleted

## 2013-01-16 ENCOUNTER — Ambulatory Visit (HOSPITAL_COMMUNITY): Payer: Managed Care, Other (non HMO) | Attending: Cardiology | Admitting: Radiology

## 2013-01-16 DIAGNOSIS — K859 Acute pancreatitis without necrosis or infection, unspecified: Secondary | ICD-10-CM | POA: Insufficient documentation

## 2013-01-16 DIAGNOSIS — R072 Precordial pain: Secondary | ICD-10-CM

## 2013-01-16 DIAGNOSIS — R079 Chest pain, unspecified: Secondary | ICD-10-CM | POA: Insufficient documentation

## 2013-01-16 DIAGNOSIS — I309 Acute pericarditis, unspecified: Secondary | ICD-10-CM

## 2013-01-16 DIAGNOSIS — I319 Disease of pericardium, unspecified: Secondary | ICD-10-CM | POA: Insufficient documentation

## 2013-01-16 DIAGNOSIS — R9389 Abnormal findings on diagnostic imaging of other specified body structures: Secondary | ICD-10-CM

## 2013-01-16 DIAGNOSIS — I079 Rheumatic tricuspid valve disease, unspecified: Secondary | ICD-10-CM | POA: Insufficient documentation

## 2013-01-16 NOTE — Progress Notes (Signed)
Echocardiogram performed.  

## 2013-01-23 ENCOUNTER — Encounter: Payer: Self-pay | Admitting: Cardiology

## 2013-01-23 ENCOUNTER — Ambulatory Visit (INDEPENDENT_AMBULATORY_CARE_PROVIDER_SITE_OTHER): Payer: Managed Care, Other (non HMO) | Admitting: Cardiology

## 2013-01-23 VITALS — BP 124/84 | HR 67 | Ht 70.0 in | Wt 195.8 lb

## 2013-01-23 DIAGNOSIS — R079 Chest pain, unspecified: Secondary | ICD-10-CM

## 2013-01-23 DIAGNOSIS — D649 Anemia, unspecified: Secondary | ICD-10-CM

## 2013-01-23 DIAGNOSIS — D509 Iron deficiency anemia, unspecified: Secondary | ICD-10-CM | POA: Insufficient documentation

## 2013-01-23 LAB — BASIC METABOLIC PANEL
BUN: 11 mg/dL (ref 6–23)
CO2: 26 mEq/L (ref 19–32)
Calcium: 9.2 mg/dL (ref 8.4–10.5)
Chloride: 110 mEq/L (ref 96–112)
Creatinine, Ser: 0.7 mg/dL (ref 0.4–1.2)
GFR: 125.44 mL/min (ref >60.00)
Glucose, Bld: 88 mg/dL (ref 70–99)
Potassium: 4.6 mEq/L (ref 3.5–5.1)
Sodium: 141 mEq/L (ref 135–145)

## 2013-01-23 LAB — CBC WITH DIFFERENTIAL/PLATELET
Basophils Absolute: 0.1 10*3/uL (ref 0.0–0.1)
Basophils Relative: 0.9 % (ref 0.0–3.0)
Eosinophils Absolute: 0.1 10*3/uL (ref 0.0–0.7)
Eosinophils Relative: 2.2 % (ref 0.0–5.0)
HCT: 28.4 % — ABNORMAL LOW (ref 36.0–46.0)
Hemoglobin: 9.1 g/dL — ABNORMAL LOW (ref 12.0–15.0)
Lymphocytes Relative: 26.6 % (ref 12.0–46.0)
Lymphs Abs: 1.7 10*3/uL (ref 0.7–4.0)
MCHC: 32 g/dL (ref 30.0–36.0)
MCV: 74.5 fl — ABNORMAL LOW (ref 78.0–100.0)
Monocytes Absolute: 0.4 10*3/uL (ref 0.1–1.0)
Monocytes Relative: 6.7 % (ref 3.0–12.0)
Neutro Abs: 4 10*3/uL (ref 1.4–7.7)
Neutrophils Relative %: 63.6 % (ref 43.0–77.0)
Platelets: 346 10*3/uL (ref 150.0–400.0)
RBC: 3.82 Mil/uL — ABNORMAL LOW (ref 3.87–5.11)
RDW: 19.7 % — ABNORMAL HIGH (ref 11.5–14.6)
WBC: 6.3 10*3/uL (ref 4.5–10.5)

## 2013-01-23 LAB — C-REACTIVE PROTEIN: CRP: <0.5 mg/dL (ref 0.5–20.0)

## 2013-01-23 LAB — SEDIMENTATION RATE: Sed Rate: 22 mm/hr (ref 0–22)

## 2013-01-23 NOTE — Progress Notes (Signed)
Patient ID: Alison Gomez, female   DOB: 1985/03/06, 28 y.o.   MRN: [id number]    Patient Name: Alison Gomez Date of Encounter: 01/23/2013  Primary Care Provider:  Reginia Naas, MD Primary Cardiologist: Ena Dawley, H  Patient Profile  Pericardial effusion on CT  Problem List   Past Medical History  Diagnosis Date  . Herpes genitalia   . Panic attack   . Trichomonas   . Previous cesarean delivery affecting pregnancy 01/10/2011  . Chest pain    Past Surgical History  Procedure Laterality Date  . Induced abortion    . Cesarean section  01/10/2011    Procedure: CESAREAN SECTION;  Surgeon: Thornell Sartorius, MD;  Location: [facility];  Service: Gynecology;  Laterality: N/A;  repeat  c/s  . Tooth extraction      Allergies  Allergies  Allergen Reactions  . Chocolate Anaphylaxis   HPI  This is a very pleasant 28 year old female who was recently seen in the ER for profound vaginal bleeding that was followed after removal of her IUD because of vaginal perforation. As a part of bleeding workup abdominal CT was performed with limited view of heart. A small amount of per cardial effusion was described. The patient states that for the last couple of months she has experienced chest pain, it is non-exertional, it can happen anytime of the day, and is constant, radiating to her back. The pain feels sharp retrosternal, and changes with the position. The patient also states that she has history of anxiety, and then she had episodes of chest pain she also starts hyperventilating and eventually feel short of breath. No palpitations no dizziness, and no syncopal episode. The patient doesn't remember any viral-like illness prior to starting these chest pain episodes. No orthopnea no paroxysmal nocturnal dyspnea.  This is a 1 month follow up, the patient couldn't afford Colchicine (200$) and didn't use ibuprofen as she doesn't like to take pain killers. She cancelled echo as she didn't feel  it was necessary since she didn't follow treatment. She continues to have the same chest pain. After 3 more weeks the patient states that her pain is on and off every other day, it is positional and resolves with Ibuprofen. She takes 600 mg BID.  Home Medications  Prior to Admission medications   Medication Sig Start Date End Date Taking? Authorizing Provider  HYDROcodone-acetaminophen (NORCO/VICODIN) 5-325 MG per tablet Take 1 tablet by mouth every 6 (six) hours as needed for pain. 10/17/12   Merryl Hacker, MD  ibuprofen (ADVIL,MOTRIN) 600 MG tablet Take 1 tablet (600 mg total) by mouth every 6 (six) hours as needed for pain. 10/17/12   Merryl Hacker, MD  levonorgestrel-ethinyl estradiol (AVIANE,ALESSE,LESSINA) 0.1-20 MG-MCG tablet Take 1 tablet by mouth daily.    Historical Provider, MD    Family History  Family History  Problem Relation Age of Onset  . Anesthesia problems Neg Hx     Social History  History   Social History  . Marital Status: Single    Spouse Name: N/A    Number of Children: N/A  . Years of Education: N/A   Occupational History  .  [employer]   Social History Main Topics  . Smoking status: Never Smoker   . Smokeless tobacco: Never Used  . Alcohol Use: Yes     Comment: not during preg  . Drug Use: No  . Sexual Activity: Yes   Other Topics Concern  . Not on file  Social History Narrative  . No narrative on file     Review of Systems, as per history of present illness otherwise negative General:  No chills, fever, night sweats or weight changes.  Cardiovascular:  No chest pain, dyspnea on exertion, edema, orthopnea, palpitations, paroxysmal nocturnal dyspnea. Dermatological: No rash, lesions/masses Respiratory: No cough, dyspnea Urologic: No hematuria, dysuria Abdominal:   No nausea, vomiting, diarrhea, bright red blood per rectum, melena, or hematemesis Neurologic:  No visual changes, wkns, changes in mental status. All other systems  reviewed and are otherwise negative except as noted above.  Physical Exam  Height '5\' 10"'  (1.778 m).  General: Pleasant, NAD Psych: Normal affect. Neuro: Alert and oriented X 3. Moves all extremities spontaneously. HEENT: Normal  Neck: Supple without bruits or JVD. Lungs:  Resp regular and unlabored, CTA. Heart: RRR no s3, s4, or murmurs. + rub systolic and diastolic rub Abdomen: Soft, non-tender, non-distended, BS + x 4.  Extremities: No clubbing, cyanosis or edema. DP/PT/Radials 2+ and equal bilaterally.  Accessory Clinical Findings  ECG - sinus rhythm with sinus arrhythmia, 60 beats per minute, no ST-T wave abnormalities, diffuse ST elevations predominantly in the inferolateral leads.  Echocardiogram - 01/16/2013  - Left ventricle: The cavity size was normal. Wall thickness was normal. Systolic function was normal. The estimated ejection fraction was in the range of 55% to 60%. Wall motion was normal; there were no regional wall motion abnormalities. Left ventricular diastolic function parameters were normal. - Pericardium, extracardiac: A small pericardial effusion was identified. Impressions:  - No prior study for comparison; normal LV function; small pericardial effusion.    Assessment & Plan  28 year old female  1. Atypical chest pain - nonexertional, positional, most probably pericarditis, there is persistent rub. A small amount of pericardial effusion is seen on the echocardiogram.   ESR, CRP, WBC are normal. I am also very inclined to order a cardiac MRI for pericardial/myocardial evaluation. Also check Crea after 1 months of NSAIDS use.  For now we will continue NSAIDS and will be inclined to start steroids in case inflammatory markers are elevated as this pericarditis has been going on for the last 3 months now.  2. Microcytic anemia - started on iron sulfate 325 mg orally twice a day a month ago, we will recheck CBC today.  Followup in 1 month.  Ena Dawley, Lemmie Evens, MD 01/23/2013, 9:41 AM

## 2013-01-23 NOTE — Patient Instructions (Addendum)
**Note De-identified  Obfuscation** Your physician recommends that you continue on your current medications as directed. Please refer to the Current Medication list given to you today.  Your physician recommends that you return for lab work in: today  Your physician recommends that you schedule a follow-up appointment in: 1 month    

## 2013-01-24 ENCOUNTER — Telehealth: Payer: Self-pay

## 2013-01-24 DIAGNOSIS — I309 Acute pericarditis, unspecified: Secondary | ICD-10-CM

## 2013-01-24 NOTE — Telephone Encounter (Signed)
**Note De-Identified Chord Takahashi Obfuscation** Message copied by Guillermo Difrancesco, Lorelle FormosaPATRICIA M on Fri Jan 24, 2013  4:05 PM ------      Message from: Lars MassonNELSON, KATARINA H      Created: Thu Jan 23, 2013  6:01 PM       Larita FifeLynn,      Could you call this patient and tell her that her labs don't suggest inflammation.       Her anemia is improving but she needs to continue taking iron pills.            Also for the definitive evaluation of her pericarditis I would like to order a  Cardiac MRI. Could you schedule it?      Thank you,      Aris LotKatarina ------

## 2013-01-24 NOTE — Telephone Encounter (Signed)
**Note De-Identified  Obfuscation** Pt advised, she verbalized understanding and agrees with plan. She is advised that someone from this office will call her to schedule her cardiac MRI.

## 2013-02-06 ENCOUNTER — Encounter (HOSPITAL_COMMUNITY): Payer: Self-pay | Admitting: Emergency Medicine

## 2013-02-06 ENCOUNTER — Emergency Department (HOSPITAL_COMMUNITY)
Admission: EM | Admit: 2013-02-06 | Discharge: 2013-02-06 | Disposition: A | Discharge status: Home / Self Care | Payer: Managed Care, Other (non HMO) | Attending: Emergency Medicine | Admitting: Emergency Medicine

## 2013-02-06 ENCOUNTER — Telehealth: Payer: Self-pay | Admitting: Cardiology

## 2013-02-06 ENCOUNTER — Encounter: Payer: Self-pay | Admitting: Cardiology

## 2013-02-06 ENCOUNTER — Emergency Department (HOSPITAL_COMMUNITY): Payer: Managed Care, Other (non HMO)

## 2013-02-06 DIAGNOSIS — R05 Cough: Secondary | ICD-10-CM | POA: Insufficient documentation

## 2013-02-06 DIAGNOSIS — Z79899 Other long term (current) drug therapy: Secondary | ICD-10-CM | POA: Insufficient documentation

## 2013-02-06 DIAGNOSIS — R0602 Shortness of breath: Secondary | ICD-10-CM | POA: Insufficient documentation

## 2013-02-06 DIAGNOSIS — Z8659 Personal history of other mental and behavioral disorders: Secondary | ICD-10-CM | POA: Insufficient documentation

## 2013-02-06 DIAGNOSIS — R0789 Other chest pain: Secondary | ICD-10-CM | POA: Insufficient documentation

## 2013-02-06 DIAGNOSIS — R059 Cough, unspecified: Secondary | ICD-10-CM | POA: Insufficient documentation

## 2013-02-06 DIAGNOSIS — N63 Unspecified lump in unspecified breast: Secondary | ICD-10-CM | POA: Insufficient documentation

## 2013-02-06 DIAGNOSIS — Z8619 Personal history of other infectious and parasitic diseases: Secondary | ICD-10-CM | POA: Insufficient documentation

## 2013-02-06 DIAGNOSIS — R079 Chest pain, unspecified: Secondary | ICD-10-CM

## 2013-02-06 LAB — BASIC METABOLIC PANEL
BUN: 10 mg/dL (ref 6–23)
CHLORIDE: 106 meq/L (ref 96–112)
CO2: 20 meq/L (ref 19–32)
Calcium: 9 mg/dL (ref 8.4–10.5)
Creatinine, Ser: 0.6 mg/dL (ref 0.50–1.10)
GFR calc Af Amer: >90 mL/min (ref >90)
GFR calc non Af Amer: >90 mL/min (ref >90)
Glucose, Bld: 92 mg/dL (ref 70–99)
Potassium: 4.2 mEq/L (ref 3.7–5.3)
SODIUM: 140 meq/L (ref 137–147)

## 2013-02-06 LAB — CBC
HCT: 30.8 % — ABNORMAL LOW (ref 36.0–46.0)
HEMOGLOBIN: 9.7 g/dL — AB (ref 12.0–15.0)
MCH: 23.8 pg — ABNORMAL LOW (ref 26.0–34.0)
MCHC: 31.5 g/dL (ref 30.0–36.0)
MCV: 75.5 fL — ABNORMAL LOW (ref 78.0–100.0)
Platelets: 296 10*3/uL (ref 150–400)
RBC: 4.08 MIL/uL (ref 3.87–5.11)
RDW: 18.5 % — ABNORMAL HIGH (ref 11.5–15.5)
WBC: 4.1 10*3/uL (ref 4.0–10.5)

## 2013-02-06 LAB — POCT I-STAT TROPONIN I: TROPONIN I, POC: 0 ng/mL (ref 0.00–0.08)

## 2013-02-06 LAB — PRO B NATRIURETIC PEPTIDE: Pro B Natriuretic peptide (BNP): 28.1 pg/mL (ref 0–125)

## 2013-02-06 LAB — D-DIMER, QUANTITATIVE: D-Dimer, Quant: <0.27 ug/mL-FEU (ref 0.00–0.48)

## 2013-02-06 MED ORDER — HYDROCODONE-ACETAMINOPHEN 5-325 MG PO TABS
1.0000 | ORAL_TABLET | Freq: Once | ORAL | Status: AC
  Administered 2013-02-06: 1 via ORAL
  Filled 2013-02-06: qty 1

## 2013-02-06 MED ORDER — HYDROCODONE-ACETAMINOPHEN 5-325 MG PO TABS: 1.0000 | ORAL_TABLET | Freq: Four times a day (QID) | ORAL | Status: DC | PRN

## 2013-02-06 MED ORDER — KETOROLAC TROMETHAMINE 30 MG/ML IJ SOLN
30.0000 mg | Freq: Once | INTRAMUSCULAR | Status: AC
Start: 2013-02-06 — End: 2013-02-06
  Administered 2013-02-06: 30 mg via INTRAVENOUS
  Filled 2013-02-06: qty 1

## 2013-02-06 NOTE — Discharge Instructions (Signed)

## 2013-02-06 NOTE — ED Notes (Signed)
Pt reports chest pain and sob for extended amount of time but have been more severe this week. Having productive cough with yellow and brown sputum. Reports a lump on right breast and having pain on palpation to bilateral chest. ekg done at triage, airway is intact.

## 2013-02-06 NOTE — Telephone Encounter (Signed)
New message    C/o chest is hurting and she says it is hard to breathe

## 2013-02-06 NOTE — Telephone Encounter (Signed)
Received call directly from operator from patient who states she is having chest pressure all over the upper part of her chest, possibly more concentrated on left side and difficulty breathing.  Patient states the pain is severe but does not give it a number on the 0-10 scale.  Patient denies n/v, states pain gets worse the longer she waits.  Patient states this pain is more severe than when she saw Dr. Delton SeeNelson in the office on 1/22.  I advised patient that with severe pain she should proceed to the ER.  Patient states they will only send her home.  I advised that patient should proceed to the ER and I will send message to Dr. Delton SeeNelson, who is not in the office today, for follow-up. Patient verbalized agreement and understanding and states her mother will drive her to the hospital.  Card holder notified.

## 2013-02-07 ENCOUNTER — Telehealth: Payer: Self-pay | Admitting: Cardiology

## 2013-02-07 ENCOUNTER — Encounter: Payer: Self-pay | Admitting: Cardiology

## 2013-02-07 MED ORDER — PREDNISONE (PAK) 10 MG PO TABS: ORAL_TABLET | ORAL | Status: DC

## 2013-02-07 NOTE — Telephone Encounter (Signed)
New Problem:  Pt is c/o chest pain. Pt states she is having chest pain now.. Pt would like like to speak to the nurse. Pt also states her arms are tingling.Marland Kitchen..Marland Kitchen

## 2013-02-07 NOTE — Telephone Encounter (Signed)
Pt was seen in ED yesterday, labs/ ekg/ cxr normal.  C/o continued chest pain left and right sides under breasts was intermittent but now more continuous.  Pt also needs work note because of her having frequent app and discomfort. Spoke with Dr Delton SeeNelson, letter written/ gave copy of Cardiac MRI letter for 2/11/ explained prednisone taper/ has f/u with Dr Delton SeeNelson 2/24. Pt voiced understanding and will come today to pick up letter.

## 2013-02-07 NOTE — ED Provider Notes (Signed)
CSN: [id number]     Arrival date & time 02/06/13  1433 History   First MD Initiated Contact with Patient 02/06/13 1751     Chief Complaint  Patient presents with  . Chest Pain  . Shortness of Breath   (Consider location/radiation/quality/duration/timing/severity/associated sxs/prior Treatment) HPI  This a 28 yo female with history of panic attacks and chest pain who presents with chest pain. Patient reports chest pain since September. I actually saw the patient at that time. She reports persistent pain. She has been seen by cardiologist. Patient states in the last week pain is worse. She states it is sharp and nonradiating. She states "I thought my heart was going to stop." She reports increased stressors at home. Current pain level is 6/10 it is worse with breathing. It is not worse with exertion. Patient has been taking Ibuprofenl with some relief.  Patient also reports a mass to her right breast. She states that it is tender to palpation.  Past Medical History  Diagnosis Date  . Herpes genitalia   . Panic attack   . Trichomonas   . Previous cesarean delivery affecting pregnancy 01/10/2011  . Chest pain    Past Surgical History  Procedure Laterality Date  . Induced abortion    . Cesarean section  01/10/2011    Procedure: CESAREAN SECTION;  Surgeon: Sherron Monday, MD;  Location: [facility];  Service: Gynecology;  Laterality: N/A;  repeat  c/s  . Tooth extraction     Family History  Problem Relation Age of Onset  . Anesthesia problems Neg Hx    History  Substance Use Topics  . Smoking status: Never Smoker   . Smokeless tobacco: Never Used  . Alcohol Use: Yes     Comment: not during preg   OB History   Grav Para Term Preterm Abortions TAB SAB Ect Mult Living   4 2 2  1 1    2      Review of Systems  Constitutional: Negative for fever.  Respiratory: Positive for cough and chest tightness. Negative for shortness of breath.   Cardiovascular: Negative for chest pain.  Gastrointestinal:  Negative for nausea, vomiting and abdominal pain.  Genitourinary: Negative for dysuria.  Musculoskeletal: Negative for back pain.  Skin: Negative for wound.  Neurological: Negative for headaches.  Psychiatric/Behavioral: Negative for confusion.  All other systems reviewed and are negative.    Allergies  Chocolate  Home Medications   Current Outpatient Rx  Name  Route  Sig  Dispense  Refill  . ferrous sulfate 325 (65 FE) MG tablet   Oral   Take 325 mg by mouth daily with breakfast.         . ibuprofen (ADVIL,MOTRIN) 200 MG tablet   Oral   Take 400 mg by mouth every 8 (eight) hours as needed for moderate pain.         . medroxyPROGESTERone (DEPO-PROVERA) 150 MG/ML injection   Intramuscular   Inject 150 mg into the muscle every 3 (three) months.          Marland Kitchen HYDROcodone-acetaminophen (NORCO/VICODIN) 5-325 MG per tablet   Oral   Take 1 tablet by mouth every 6 (six) hours as needed for moderate pain.   10 tablet   0    BP 124/86  Pulse 53  Temp(Src) 97.6 F (36.4 C) (Oral)  Resp 18  Ht 5\' 11"  (1.803 m)  Wt 190 lb (86.183 kg)  BMI 26.51 kg/m2  SpO2 95% Physical Exam  Nursing note and  vitals reviewed. Constitutional: She is oriented to person, place, and time. She appears well-developed and well-nourished.  tearful  HENT:  Head: Normocephalic and atraumatic.  Eyes: Pupils are equal, round, and reactive to light.  Neck: Neck supple.  Cardiovascular: Normal rate, regular rhythm and normal heart sounds.   No murmur heard. Pulmonary/Chest: Effort normal. No respiratory distress. She has no wheezes. She exhibits tenderness.  Tenderness to palpation of the right rib cage just superior to the breast, no mass noted  Abdominal: Soft. Bowel sounds are normal.  Neurological: She is alert and oriented to person, place, and time.  Skin: Skin is warm and dry.  Psychiatric: She has a normal mood and affect.    ED Course  Procedures (including critical care time) Labs  Review Labs Reviewed  CBC - Abnormal; Notable for the following:    Hemoglobin 9.7 (*)    HCT 30.8 (*)    MCV 75.5 (*)    MCH 23.8 (*)    RDW 18.5 (*)    All other components within normal limits  BASIC METABOLIC PANEL  PRO B NATRIURETIC PEPTIDE  D-DIMER, QUANTITATIVE  POCT I-STAT TROPONIN I   Imaging Review Dg Chest 2 View  02/06/2013   CLINICAL DATA:  Chest pain, shortness of breath, history of tobacco use  EXAM: CHEST  2 VIEW  COMPARISON:  CT ABD/PELV WO CM dated 09/07/2012; DG CHEST 2 VIEW dated 08/27/2012  FINDINGS: The heart size and mediastinal contours are within normal limits. Both lungs are clear. The visualized skeletal structures are unremarkable.  IMPRESSION: No active cardiopulmonary disease.   Electronically Signed   By: Salome HolmesHector  Cooper M.D.   On: 02/06/2013 15:16    EKG Interpretation   None       MDM   1. Chest pain    Patient presents for chest pain. He is nontoxic-appearing on exam. She's very anxious. Workup is negative including troponin and d-dimer. Patient's has tenderness palpation over the right chest wall. No obvious mass. Patient was given Toradol. Patient is to followup with her primary doctor and cardiologist. Patient given referral to the breast Center given her concerns for breast mass.  After history, exam, and medical workup I feel the patient has been appropriately medically screened and is safe for discharge home. Pertinent diagnoses were discussed with the patient. Patient was given return precautions.     Shon Batonourtney F Amnah Breuer, MD 02/07/13 [phone]

## 2013-02-10 ENCOUNTER — Other Ambulatory Visit: Payer: Self-pay

## 2013-02-10 MED ORDER — PREDNISONE (PAK) 10 MG PO TABS: ORAL_TABLET | ORAL | Status: DC

## 2013-02-12 ENCOUNTER — Ambulatory Visit (HOSPITAL_COMMUNITY): Admission: RE | Admit: 2013-02-12 | Payer: Managed Care, Other (non HMO) | Source: Ambulatory Visit

## 2013-02-12 ENCOUNTER — Telehealth: Payer: Self-pay | Admitting: Cardiology

## 2013-02-12 NOTE — Telephone Encounter (Signed)
Pt Dropped Off " [employer]" Capabilities & Limitations Form to be Completed By Dr.Nelson Mariea ClontsLynn V is Covering Dr.Nelson Thursday & Friday Will give to DrydenLynn on 2.12.15/kdm

## 2013-02-14 ENCOUNTER — Telehealth: Payer: Self-pay

## 2013-02-14 ENCOUNTER — Telehealth: Payer: Self-pay | Admitting: Cardiology

## 2013-02-14 NOTE — Telephone Encounter (Signed)
Dr.Nelson Singed Capabilities & Limitations Form For pt Job, pt made Aware she Will pick up today Left at [employer] For Pt

## 2013-02-14 NOTE — Telephone Encounter (Signed)
Per Dr Delton SeeNelson this pt needs to have a cardiac MRI for pericarditis. The pt states that she cancelled her cardiac MRI that was scheduled for 2/11 because she was advised by the hospital that it will cost her $400 even with her insurance. The pt states that she cannot afford that at this time. Is there another test you would like to order? Please advise.

## 2013-02-17 NOTE — Telephone Encounter (Signed)
Returned call to patient no answer.Unable to leave message,no answering machine.

## 2013-02-17 NOTE — Telephone Encounter (Signed)
No, there is no other test, but we will switch ibuprofen to indomethacin 50 mg PO TID until the next visit in my clinic. Aris LotKatarina

## 2013-02-19 ENCOUNTER — Other Ambulatory Visit: Payer: Self-pay | Admitting: Family Medicine

## 2013-02-19 DIAGNOSIS — N644 Mastodynia: Secondary | ICD-10-CM

## 2013-02-21 NOTE — Telephone Encounter (Signed)
LMTCB

## 2013-02-24 MED ORDER — INDOMETHACIN 50 MG PO CAPS: ORAL_CAPSULE | ORAL | Status: DC

## 2013-02-24 NOTE — Telephone Encounter (Signed)
Returned call to patient no answer.LMTC. 

## 2013-02-24 NOTE — Telephone Encounter (Signed)
Follow up ° ° ° ° °Returned a nurses call °

## 2013-02-24 NOTE — Telephone Encounter (Signed)
Returned call to patient she stated she is scheduled to have 4 teeth pulled tomorrow 02/25/13 and will need to reschedule appointment with Dr.Nelson.Appointment with Dr.Nelson rescheduled to 03/05/13.Dr.Nelson advised to stop Ibuprofen and start Indomethacin 50 mg three times a day with food.Prescription sent to pharmacy.Patient stated she cannot afford to have MRI done.Also her mother will be bringing form that Dr.Nelson completed last week.Stated she wanted Dr.Nelson to write at bottom of page patient will need 2 10 min breaks during her 8 hr shift.Message sent to Dr.Nelson's nurse Larita FifeLynn.

## 2013-02-25 ENCOUNTER — Ambulatory Visit: Payer: Managed Care, Other (non HMO) | Admitting: Cardiology

## 2013-02-25 ENCOUNTER — Other Ambulatory Visit: Payer: Managed Care, Other (non HMO)

## 2013-03-06 ENCOUNTER — Ambulatory Visit
Admission: RE | Admit: 2013-03-06 | Discharge: 2013-03-06 | Disposition: A | Discharge status: Home / Self Care | Payer: Managed Care, Other (non HMO) | Source: Ambulatory Visit | Attending: Family Medicine | Admitting: Family Medicine

## 2013-03-06 ENCOUNTER — Encounter (INDEPENDENT_AMBULATORY_CARE_PROVIDER_SITE_OTHER): Payer: Self-pay

## 2013-03-06 ENCOUNTER — Ambulatory Visit: Admission: RE | Admit: 2013-03-06 | Payer: Managed Care, Other (non HMO) | Source: Ambulatory Visit

## 2013-03-06 ENCOUNTER — Other Ambulatory Visit: Payer: Self-pay | Admitting: Family Medicine

## 2013-03-06 ENCOUNTER — Encounter: Payer: Self-pay | Admitting: Cardiology

## 2013-03-06 ENCOUNTER — Ambulatory Visit (INDEPENDENT_AMBULATORY_CARE_PROVIDER_SITE_OTHER): Payer: Managed Care, Other (non HMO) | Admitting: Cardiology

## 2013-03-06 VITALS — BP 112/76 | HR 70 | Ht 70.0 in | Wt 189.1 lb

## 2013-03-06 DIAGNOSIS — M25539 Pain in unspecified wrist: Secondary | ICD-10-CM

## 2013-03-06 DIAGNOSIS — I309 Acute pericarditis, unspecified: Secondary | ICD-10-CM

## 2013-03-06 DIAGNOSIS — D509 Iron deficiency anemia, unspecified: Secondary | ICD-10-CM

## 2013-03-06 DIAGNOSIS — R079 Chest pain, unspecified: Secondary | ICD-10-CM

## 2013-03-06 NOTE — Patient Instructions (Signed)
Your physician has recommended you make the following change in your medication: stop taking Indocin  Your physician recommends that you schedule a follow-up appointment in: as needed

## 2013-03-06 NOTE — Progress Notes (Signed)
Patient ID: SHANELE NISSAN, female   DOB: 1985-12-17, 28 y.o.   MRN: [id number]    Patient Name: Alison Gomez Date of Encounter: 03/06/2013  Primary Care Provider:  Reginia Naas, MD Primary Cardiologist: Ena Dawley H  Patient Profile  Pericardial effusion on CT  Problem List   Past Medical History  Diagnosis Date  . Herpes genitalia   . Panic attack   . Trichomonas   . Previous cesarean delivery affecting pregnancy 01/10/2011  . Chest pain    Past Surgical History  Procedure Laterality Date  . Induced abortion    . Cesarean section  01/10/2011    Procedure: CESAREAN SECTION;  Surgeon: Thornell Sartorius, MD;  Location: [facility];  Service: Gynecology;  Laterality: N/A;  repeat  c/s  . Tooth extraction      Allergies  Allergies  Allergen Reactions  . Chocolate Anaphylaxis   HPI  This is a very pleasant 28 year old female who was recently seen in the ER for profound vaginal bleeding that was followed after removal of her IUD because of vaginal perforation. As a part of bleeding workup abdominal CT was performed with limited view of heart. A small amount of per cardial effusion was described. The patient states that for the last couple of months she has experienced chest pain, it is non-exertional, it can happen anytime of the day, and is constant, radiating to her back. The pain feels sharp retrosternal, and changes with the position. The patient also states that she has history of anxiety, and then she had episodes of chest pain she also starts hyperventilating and eventually feel short of breath. No palpitations no dizziness, and no syncopal episode. The patient doesn't remember any viral-like illness prior to starting these chest pain episodes. No orthopnea no paroxysmal nocturnal dyspnea.  On 01/23/2013: This is a 1 month follow up, the patient couldn't afford Colchicine (200$) and didn't use ibuprofen as she doesn't like to take pain killers. She cancelled echo as she  didn't feel it was necessary since she didn't follow treatment. She continues to have the same chest pain. After 3 more weeks the patient states that her pain is on and off every other day, it is positional and resolves with Ibuprofen. She takes 600 mg BID.  On 03/06/2013 : 6 week follow up - her CP has resolved, she gets occasional sharp chest pain. She had her wisdom teeth removed the last week and was started on Naproxen.  Home Medications  Prior to Admission medications   Medication Sig Start Date End Date Taking? Authorizing Provider  HYDROcodone-acetaminophen (NORCO/VICODIN) 5-325 MG per tablet Take 1 tablet by mouth every 6 (six) hours as needed for pain. 10/17/12   Merryl Hacker, MD  ibuprofen (ADVIL,MOTRIN) 600 MG tablet Take 1 tablet (600 mg total) by mouth every 6 (six) hours as needed for pain. 10/17/12   Merryl Hacker, MD  levonorgestrel-ethinyl estradiol (AVIANE,ALESSE,LESSINA) 0.1-20 MG-MCG tablet Take 1 tablet by mouth daily.    Historical Provider, MD    Family History  Family History  Problem Relation Age of Onset  . Anesthesia problems Neg Hx     Social History  History   Social History  . Marital Status: Single    Spouse Name: N/A    Number of Children: N/A  . Years of Education: N/A   Occupational History  .  At [employer]   Social History Main Topics  . Smoking status: Never Smoker   . Smokeless tobacco: Never  Used  . Alcohol Use: Yes     Comment: not during preg  . Drug Use: No  . Sexual Activity: Yes    Birth Control/ Protection: Injection   Other Topics Concern  . Not on file   Social History Narrative  . No narrative on file     Review of Systems, as per history of present illness otherwise negative General:  No chills, fever, night sweats or weight changes.  Cardiovascular:  No chest pain, dyspnea on exertion, edema, orthopnea, palpitations, paroxysmal nocturnal dyspnea. Dermatological: No rash, lesions/masses Respiratory: No cough,  dyspnea Urologic: No hematuria, dysuria Abdominal:   No nausea, vomiting, diarrhea, bright red blood per rectum, melena, or hematemesis Neurologic:  No visual changes, wkns, changes in mental status. All other systems reviewed and are otherwise negative except as noted above.  Physical Exam  Blood pressure 112/76, pulse 70, height _0  (1.778 m), weight 189 lb 1.9 oz (85.784 kg).  General: Pleasant, NAD Psych: Normal affect. Neuro: Alert and oriented X 3. Moves all extremities spontaneously. HEENT: Normal  Neck: Supple without bruits or JVD. Lungs:  Resp regular and unlabored, CTA. Heart: RRR no s3, s4, or murmurs. no rub Abdomen: Soft, non-tender, non-distended, BS + x 4.  Extremities: No clubbing, cyanosis or edema. DP/PT/Radials 2+ and equal bilaterally.  Accessory Clinical Findings  ECG - sinus rhythm with sinus arrhythmia, 60 beats per minute, no ST-T wave abnormalities, diffuse ST elevations predominantly in the inferolateral leads.  Echocardiogram - 01/16/2013  - Left ventricle: The cavity size was normal. Wall thickness was normal. Systolic function was normal. The estimated ejection fraction was in the range of 55% to 60%. Wall motion was normal; there were no regional wall motion abnormalities. Left ventricular diastolic function parameters were normal. - Pericardium, extracardiac: A small pericardial effusion was identified. Impressions:  - No prior study for comparison; normal LV function; small pericardial effusion.    Assessment & Plan  28 year old female  1. Atypical chest pain - nonexertional, positional, most probably pericarditis, the rub and symptoms have resolved. ESR, CRP, WBC were normal. A small amount of pericardial effusion is seen on the echocardiogram. MRI not performed because of financial concern. However, since she is taking naproxen for tooth pain, and she is asymptomatic, we will discontinue indomethacin..  2. Microcytic anemia - started  on iron sulfate 325 mg orally twice a day a month ago, repeated Hb 8.9 -->9.5, she is not always complaint to FeS pills, and states that she has been anemic since her teenage years. We recommend to continue FeS.    Followup as needed.  Dorothy Spark, MD 03/06/2013, 11:08 AM

## 2013-03-07 ENCOUNTER — Ambulatory Visit
Admission: RE | Admit: 2013-03-07 | Discharge: 2013-03-07 | Disposition: A | Discharge status: Home / Self Care | Payer: Managed Care, Other (non HMO) | Source: Ambulatory Visit | Attending: Family Medicine | Admitting: Family Medicine

## 2013-03-11 ENCOUNTER — Telehealth: Payer: Self-pay | Admitting: Cardiology

## 2013-03-19 ENCOUNTER — Encounter: Payer: Self-pay | Admitting: Cardiology

## 2013-03-19 ENCOUNTER — Ambulatory Visit (INDEPENDENT_AMBULATORY_CARE_PROVIDER_SITE_OTHER): Payer: Managed Care, Other (non HMO) | Admitting: Cardiology

## 2013-03-19 VITALS — BP 121/64 | HR 72 | Ht 70.0 in | Wt 194.0 lb

## 2013-03-19 DIAGNOSIS — R079 Chest pain, unspecified: Secondary | ICD-10-CM

## 2013-03-19 DIAGNOSIS — I309 Acute pericarditis, unspecified: Secondary | ICD-10-CM

## 2013-03-19 MED ORDER — IBUPROFEN 600 MG PO TABS: 600.0000 mg | ORAL_TABLET | Freq: Three times a day (TID) | ORAL | Status: DC

## 2013-03-19 NOTE — Patient Instructions (Addendum)
Your physician has recommended you make the following change in your medication: 1. Continue ibuprofen 600 MG 1 tablet Three times daily for seven days.  Your physician has requested that you have a cardiac MRI. Cardiac MRI uses a computer to create images of your heart as its beating, producing both still and moving pictures of your heart and major blood vessels. For further information please visit [email or web address]. Please follow the instruction sheet given to you today for more information.  Your physician recommends that you schedule a follow-up appointment in: 1 Week with Dr Delton SeeNelson

## 2013-03-20 NOTE — Progress Notes (Signed)
[address] [place], [place]  [address] Phone: [phone] Fax:  [phone]  Date:  03/20/2013   ID:  Alison SayerValinda C Gomez, DOB [birth year], MRN [id number]  PCP:  Allean FoundSMITH,CANDACE THIELE, MD  Cardiologist:  Dr. Delton SeeNelson     History of Present Illness: Alison Gomez is a 28 y.o. female who was recently evaluated by Dr. Delton SeeNelson for chest pain.  At that time she was post ER visit due to profound vaginal bleeding that was followed after removal of her IUD because of vaginal perforation. As a part of bleeding workup abdominal CT was performed with limited view of heart. A small amount of percardial effusion was described. The patient stated at that time that for the last couple of months she had experienced chest pain,  non-exertional, at anytime of the day, constant, radiating to her back. The pain feelt, sharp retrosternal, and changed with the position. The patient also stated that she has history of anxiety, and when she gets the episodes of chest pain she also starts hyperventilating and eventually feel short of breath. No palpitations no dizziness, and no syncopal episode. The patient doesn't remember any viral-like illness prior to starting these chest pain episodes. No orthopnea no paroxysmal nocturnal dyspnea. Dr. Delton SeeNelson initially recommended Cochicine but the patient couldn't afford Colchicine (200$) and didn't use ibuprofen as she doesn't like to take pain killers. She cancelled echo as she didn't feel it was necessary since she didn't follow treatment. She continued to have the same chest pain on and off every other day, it is positional and resolves with Ibuprofen. On 03/06/2013 : 6 week follow up - her CP has resolved, she still would get occasional sharp chest pain. She now presents today for a second opinion at the urging of her mother.  She says that she still gets sharp chest pain, sometimes worse with movement and sometimes worse with deep breathing.  It goes away if she takes the  Ibuprofen but then reoccurs if she stops taking it.  This has caused her great stress at work.  Her CRP.d-dimer/BNP and troponin were all normal.  2D echo showed a trivial PE.    Home Medications    Wt Readings from Last 3 Encounters:  03/19/13 194 lb (87.998 kg)  03/06/13 189 lb 1.9 oz (85.784 kg)  02/06/13 190 lb (86.183 kg)     Past Medical History  Diagnosis Date  . Herpes genitalia   . Panic attack   . Trichomonas   . Previous cesarean delivery affecting pregnancy 01/10/2011  . Chest pain     Current Outpatient Prescriptions  Medication Sig Dispense Refill  . ALPRAZolam (XANAX) 0.5 MG tablet Take 0.5 mg by mouth at bedtime as needed.       . ferrous sulfate 325 (65 FE) MG tablet Take 325 mg by mouth daily with breakfast.      . ibuprofen (ADVIL,MOTRIN) 600 MG tablet Take 1 tablet (600 mg total) by mouth 3 (three) times daily. For 7 days  30 tablet    . medroxyPROGESTERone (DEPO-PROVERA) 150 MG/ML injection Inject 150 mg into the muscle every 3 (three) months.        No current facility-administered medications for this visit.    Allergies:    Allergies  Allergen Reactions  . Chocolate Anaphylaxis    Social History:  The patient  reports that she has never smoked. She has never used smokeless tobacco. She reports that she drinks alcohol. She reports that she does  not use illicit drugs.   Family History:  The patient's family history is negative for Anesthesia problems.   ROS:  Please see the history of present illness.      All other systems reviewed and negative.   PHYSICAL EXAM: VS:  BP 121/64  Pulse 72  Ht 5\' 10"  (1.778 m)  Wt 194 lb (87.998 kg)  BMI 27.84 kg/m2 Well nourished, well developed, in no acute distress HEENT: normal Neck: no JVD Cardiac:  normal S1, S2; RRR; no murmur Lungs:  clear to auscultation bilaterally, no wheezing, rhonchi or rales Abd: soft, nontender, no hepatomegaly Ext: no edema Skin: warm and dry Neuro:  CNs 2-12 intact, no focal  abnormalities noted      ASSESSMENT AND PLAN:  1. Pleuritic CP that is consistent with Pericarditis.  I am not convinced that she took the NSAIDS on a daily basis enough to resolve her pericarditis.  Her CP also may not be due to pericarditis and could be musculoskeletal.  On exam I do not hear a cardiac rub.  At this time I have recommended that she take Ibuprofen 600mg  TID for 7 days along with Pepcid AC 20mg  daly.  She refuses Colchicine due to cost.  I have reccommended proceeding with Cardiac MRI as recommended by Dr. Delton See and then followup with her in 2 weeks.  Signed, Armanda Magic, MD 03/20/2013 8:57 AM

## 2013-03-26 ENCOUNTER — Encounter: Payer: Self-pay | Admitting: Cardiology

## 2013-03-26 ENCOUNTER — Ambulatory Visit (INDEPENDENT_AMBULATORY_CARE_PROVIDER_SITE_OTHER): Payer: Managed Care, Other (non HMO) | Admitting: Cardiology

## 2013-03-26 VITALS — BP 122/68 | HR 68 | Ht 70.0 in | Wt 195.0 lb

## 2013-03-26 DIAGNOSIS — I309 Acute pericarditis, unspecified: Secondary | ICD-10-CM

## 2013-03-26 DIAGNOSIS — D509 Iron deficiency anemia, unspecified: Secondary | ICD-10-CM

## 2013-03-26 DIAGNOSIS — R079 Chest pain, unspecified: Secondary | ICD-10-CM

## 2013-03-26 NOTE — Progress Notes (Signed)
Patient ID: ALYNAH SCHONE, female   DOB: Dec 30, 1985, 28 y.o.   MRN: [id number]     Patient Name: Alison Gomez Date of Encounter: 03/26/2013  Primary Care Provider:  Reginia Naas, MD Primary Cardiologist: Ena Dawley H  Patient Profile  Pericardial effusion on CT  Problem List   Past Medical History  Diagnosis Date  . Herpes genitalia   . Panic attack   . Trichomonas   . Previous cesarean delivery affecting pregnancy 01/10/2011  . Chest pain    Past Surgical History  Procedure Laterality Date  . Induced abortion    . Cesarean section  01/10/2011    Procedure: CESAREAN SECTION;  Surgeon: Thornell Sartorius, MD;  Location: [facility];  Service: Gynecology;  Laterality: N/A;  repeat  c/s  . Tooth extraction      Allergies  Allergies  Allergen Reactions  . Chocolate Anaphylaxis   HPI  This is a very pleasant 28 year old female who was recently seen in the ER for profound vaginal bleeding that was followed after removal of her IUD because of vaginal perforation. As a part of bleeding workup abdominal CT was performed with limited view of heart. A small amount of per cardial effusion was described. The patient states that for the last couple of months she has experienced chest pain, it is non-exertional, it can happen anytime of the day, and is constant, radiating to her back. The pain feels sharp retrosternal, and changes with the position. The patient also states that she has history of anxiety, and then she had episodes of chest pain she also starts hyperventilating and eventually feel short of breath. No palpitations no dizziness, and no syncopal episode. The patient doesn't remember any viral-like illness prior to starting these chest pain episodes. No orthopnea no paroxysmal nocturnal dyspnea.  On 01/23/2013: This is a 1 month follow up, the patient couldn't afford Colchicine (200$) and didn't use ibuprofen as she doesn't like to take pain killers. She cancelled echo as  she didn't feel it was necessary since she didn't follow treatment. She continues to have the same chest pain. After 3 more weeks the patient states that her pain is on and off every other day, it is positional and resolves with Ibuprofen. She takes 600 mg BID.  On 03/06/2013 : 6 week follow up - her CP has resolved, she gets occasional sharp chest pain. She had her wisdom teeth removed the last week and was started on Naproxen.  Shortly after the last visit patient's chest pain appeared again and was loaded different car care with right-sided chest wall pain and back pain. She saw Dr. Radford Pax second opinion and she recommended continuing the ibuprofen. She has been taking 600 mg of ibuprofen 3 times a day with improvement of her symptoms while she's taking but recurrence when she stops taking it. She is scheduled for cardiac MRI next Monday, 03/31/2013.    Home Medications  Prior to Admission medications   Medication Sig Start Date End Date Taking? Authorizing Provider  HYDROcodone-acetaminophen (NORCO/VICODIN) 5-325 MG per tablet Take 1 tablet by mouth every 6 (six) hours as needed for pain. 10/17/12   Merryl Hacker, MD  ibuprofen (ADVIL,MOTRIN) 600 MG tablet Take 1 tablet (600 mg total) by mouth every 6 (six) hours as needed for pain. 10/17/12   Merryl Hacker, MD  levonorgestrel-ethinyl estradiol (AVIANE,ALESSE,LESSINA) 0.1-20 MG-MCG tablet Take 1 tablet by mouth daily.    Historical Provider, MD    Family History  Family History  Problem Relation Age of Onset  . Anesthesia problems Neg Hx     Social History  History   Social History  . Marital Status: Single    Spouse Name: N/A    Number of Children: N/A  . Years of Education: N/A   Occupational History  .  [employer]   Social History Main Topics  . Smoking status: Never Smoker   . Smokeless tobacco: Never Used  . Alcohol Use: Yes     Comment: not during preg  . Drug Use: No  . Sexual Activity: Yes    Birth  Control/ Protection: Injection   Other Topics Concern  . Not on file   Social History Narrative  . No narrative on file     Review of Systems, as per history of present illness otherwise negative General:  No chills, fever, night sweats or weight changes.  Cardiovascular:  No chest pain, dyspnea on exertion, edema, orthopnea, palpitations, paroxysmal nocturnal dyspnea. Dermatological: No rash, lesions/masses Respiratory: No cough, dyspnea Urologic: No hematuria, dysuria Abdominal:   No nausea, vomiting, diarrhea, bright red blood per rectum, melena, or hematemesis Neurologic:  No visual changes, wkns, changes in mental status. All other systems reviewed and are otherwise negative except as noted above.  Physical Exam  Blood pressure 122/68, pulse 68, height _0  (1.778 m), weight 195 lb (88.451 kg).  General: Pleasant, NAD Psych: Normal affect. Neuro: Alert and oriented X 3. Moves all extremities spontaneously. HEENT: Normal  Neck: Supple without bruits or JVD. Lungs:  Resp regular and unlabored, CTA. Heart: RRR no s3, s4, or murmurs. no rub Abdomen: Soft, non-tender, non-distended, BS + x 4.  Extremities: No clubbing, cyanosis or edema. DP/PT/Radials 2+ and equal bilaterally.  Accessory Clinical Findings  ECG - sinus rhythm with sinus arrhythmia, 60 beats per minute, no ST-T wave abnormalities, diffuse ST elevations predominantly in the inferolateral leads.  Echocardiogram - 01/16/2013  - Left ventricle: The cavity size was normal. Wall thickness was normal. Systolic function was normal. The estimated ejection fraction was in the range of 55% to 60%. Wall motion was normal; there were no regional wall motion abnormalities. Left ventricular diastolic function parameters were normal. - Pericardium, extracardiac: A small pericardial effusion was identified. Impressions:  - No prior study for comparison; normal LV function; small pericardial effusion.    Assessment  & Plan  28 year old female  1. Atypical chest pain - nonexertional, positional, most probably pericarditis, the rub and symptoms have resolved. ESR, CRP, WBC were normal. A small amount of pericardial effusion is seen on the echocardiogram. The patient will undergo a cardiac MRI 03/31/2013. For now we will continue with ibuprofen 600 mg by mouth 3 times a day.  2. Microcytic anemia - started on iron sulfate 325 mg orally twice a day a month ago, repeated Hb 8.9 -->9.5, she is not always complaint to FeS pills, and states that she has been anemic since her teenage years. We recommend to continue FeS.    Followup as needed.  Dorothy Spark, MD 03/26/2013, 2:58 PM

## 2013-03-26 NOTE — Patient Instructions (Signed)
Your physician recommends that you schedule a follow-up appointment as needed  

## 2013-03-31 ENCOUNTER — Ambulatory Visit (HOSPITAL_COMMUNITY)
Admission: RE | Admit: 2013-03-31 | Discharge: 2013-03-31 | Disposition: A | Discharge status: Home / Self Care | Payer: Managed Care, Other (non HMO) | Source: Ambulatory Visit | Attending: Cardiology | Admitting: Cardiology

## 2013-03-31 ENCOUNTER — Encounter: Payer: Self-pay | Admitting: General Surgery

## 2013-03-31 DIAGNOSIS — I309 Acute pericarditis, unspecified: Secondary | ICD-10-CM | POA: Insufficient documentation

## 2013-03-31 DIAGNOSIS — R079 Chest pain, unspecified: Secondary | ICD-10-CM | POA: Insufficient documentation

## 2013-03-31 LAB — CREATININE, SERUM
Creatinine, Ser: 0.58 mg/dL (ref 0.50–1.10)
GFR calc Af Amer: >90 mL/min (ref >90)
GFR calc non Af Amer: >90 mL/min (ref >90)

## 2013-03-31 MED ORDER — GADOBENATE DIMEGLUMINE 529 MG/ML IV SOLN
28.0000 mL | Freq: Once | INTRAVENOUS | Status: AC | PRN
  Administered 2013-03-31: 28 mL via INTRAVENOUS

## 2013-04-01 NOTE — Telephone Encounter (Signed)
Follow up  Pt called for MRI results,, please call

## 2013-04-01 NOTE — Telephone Encounter (Signed)
Per calling for results of MRI she had yesterday.  States she was told it would be read yesterday and she should have been called already.  I explained to her the results are not in the computer system as of yet and that I am unable to tell her when the testing would be read.  She requests I send this message to Dr Delton SeeNelson.

## 2013-04-02 ENCOUNTER — Telehealth: Payer: Self-pay | Admitting: *Deleted

## 2013-04-02 NOTE — Telephone Encounter (Signed)
I will call her later today

## 2013-04-02 NOTE — Telephone Encounter (Signed)
Left message with patient Dr Delton SeeNelson will call her she is not in this office today

## 2013-04-02 NOTE — Telephone Encounter (Signed)
New Message ° °Pt returned call for results//SR  °

## 2013-04-02 NOTE — Telephone Encounter (Signed)
Patient called again today for MRI results stating that no one called her back yesterday. Did discuss conversation she had with Pam yesterday and that message was being routed to Dr Delton SeeNelson. Explained to patient Dr Delton SeeNelson not in the office but would send another message to her letting her know that she was anxious to get the results. Will forward to Mariea ClontsLynn V LPN and Dr Delton SeeNelson for review.

## 2013-04-03 MED ORDER — COLCHICINE 0.6 MG PO TABS: 0.6000 mg | ORAL_TABLET | Freq: Two times a day (BID) | ORAL | Status: DC

## 2013-04-03 NOTE — Telephone Encounter (Signed)
Larita FifeLynn, Could you call her pharmacy for a prescription for Colchicine 0.6 mg po bid with 2 refills? No need to call, I called her already. Thank you, K

## 2013-04-03 NOTE — Telephone Encounter (Signed)
**Note De-Identified  Obfuscation** [facility]

## 2013-04-03 NOTE — Progress Notes (Signed)
Patient ID: Mardene SayerValinda C Gomez, female   DOB: November 16, 1985, 28 y.o.   MRN: [id number]  Cardiac MRI showed persistent moderate pericardial effusion and mild pericardial inflammation. We will start Colchicine 0.6 mg PO BID. Follow up in 1 month.  Lars MassonELSON, Caylee Vlachos H 04/03/2013

## 2013-05-05 ENCOUNTER — Encounter: Payer: Self-pay | Admitting: Cardiology

## 2013-05-05 ENCOUNTER — Telehealth: Payer: Self-pay | Admitting: Cardiology

## 2013-05-05 NOTE — Telephone Encounter (Signed)
Pt calls in today very anxious and rambling about feeling anxious which to leading to SOB and CP. She reports she is taking her alprazolam and her "heart medication" but it doesn't seem to be helping.  She continues on to say that basically what she needs is a letter from Dr Delton SeeNelson stating that she needs a 5 to 10 minute break to "get herself together" when this occurs and she is at work.  She reports she is taking her alprazolam which causes her to not be able to focus/sluggish and people at work are getting upset with her.  When this occurs she gets more upset and anxious which leads to SOB and CP.   Advised I will forward this information to Dr Delton SeeNelson.  The pt would like to be called once a letter has been done and she will pick it up.

## 2013-05-05 NOTE — Telephone Encounter (Signed)
New message     C/o chest hurting and trouble breathing---pt took her xanax medication and heart medications. As she talks, she is really getting herself worked up. Also, she needs Dr Delton SeeNelson to extend the date on her note saying she should take off 4 days a month and add break time so that she can get her self together.  Same note as before just add break time and new date. Pt admits having anxiety attacks.

## 2013-05-07 NOTE — Telephone Encounter (Signed)
Pt called and states she wanted to keep Dr Delton SeeNelson and nurse updated with upcoming appointment with PCP on 05/08/13. Pt states that once she is seen by her PCP she will have them fax their assessment piece to our office for Dr Delton SeeNelson review.  Also pt states that from her last OV with Dr Delton SeeNelson, her AVS said to follow up with Dr Delton SeeNelson in 1 month, and nobody scheduled this.  Reviewed note and scheduled pt to have a follow-up appointment with Dr Delton SeeNelson on 05/19/13 at 3:45pm.  Pt verbalized understanding and very pleased with assistance provided.

## 2013-05-07 NOTE — Telephone Encounter (Signed)
Follow up      Talk to the nurse to tell you what she is going to do for the next two weeks.

## 2013-05-19 ENCOUNTER — Ambulatory Visit (INDEPENDENT_AMBULATORY_CARE_PROVIDER_SITE_OTHER): Payer: Managed Care, Other (non HMO) | Admitting: Cardiology

## 2013-05-19 ENCOUNTER — Encounter: Payer: Self-pay | Admitting: Cardiology

## 2013-05-19 VITALS — BP 120/72 | HR 60 | Wt 203.0 lb

## 2013-05-19 DIAGNOSIS — I309 Acute pericarditis, unspecified: Secondary | ICD-10-CM

## 2013-05-19 DIAGNOSIS — D509 Iron deficiency anemia, unspecified: Secondary | ICD-10-CM

## 2013-05-19 DIAGNOSIS — R079 Chest pain, unspecified: Secondary | ICD-10-CM

## 2013-05-19 DIAGNOSIS — E039 Hypothyroidism, unspecified: Secondary | ICD-10-CM

## 2013-05-19 DIAGNOSIS — E0590 Thyrotoxicosis, unspecified without thyrotoxic crisis or storm: Secondary | ICD-10-CM | POA: Insufficient documentation

## 2013-05-19 MED ORDER — COLCHICINE 0.6 MG PO TABS: 0.6000 mg | ORAL_TABLET | Freq: Two times a day (BID) | ORAL | Status: DC

## 2013-05-19 NOTE — Patient Instructions (Signed)
Your physician recommends that you continue on your current medications as directed. Please refer to the Current Medication list given to you today.  REFILLED YOUR COLCHICINE   Your physician has requested that you have an echocardiogram. Echocardiography is a painless test that uses sound waves to create images of your heart. It provides your doctor with information about the size and shape of your heart and how well your heart's chambers and valves are working. This procedure takes approximately one hour. There are no restrictions for this procedure.  Your physician recommends that you schedule a follow-up appointment in: 3 MONTHS WITH DR Delton SeeNELSON

## 2013-05-19 NOTE — Progress Notes (Signed)
Patient ID: Alison Gomez, female   DOB: [birth year], 28 y.o.   MRN: [id number]    Patient Name: Alison Gomez Date of Encounter: 05/19/2013  Primary Care Provider:  Reginia Naas, MD Primary Cardiologist: Dorothy Spark  Patient Profile  Pericardial effusion on CT  Problem List   Past Medical History  Diagnosis Date  . Herpes genitalia   . Panic attack   . Trichomonas   . Previous cesarean delivery affecting pregnancy 01/10/2011  . Chest pain    Past Surgical History  Procedure Laterality Date  . Induced abortion    . Cesarean section  01/10/2011    Procedure: CESAREAN SECTION;  Surgeon: Thornell Sartorius, MD;  Location: [facility];  Service: Gynecology;  Laterality: N/A;  repeat  c/s  . Tooth extraction      Allergies  Allergies  Allergen Reactions  . Chocolate Anaphylaxis   HPI  This is a very pleasant 28 year old female who was recently seen in the ER for profound vaginal bleeding that was followed after removal of her IUD because of vaginal perforation. As a part of bleeding workup abdominal CT was performed with limited view of heart. A small amount of per cardial effusion was described. The patient states that for the last couple of months she has experienced chest pain, it is non-exertional, it can happen anytime of the day, and is constant, radiating to her back. The pain feels sharp retrosternal, and changes with the position. The patient also states that she has history of anxiety, and then she had episodes of chest pain she also starts hyperventilating and eventually feel short of breath. No palpitations no dizziness, and no syncopal episode. The patient doesn't remember any viral-like illness prior to starting these chest pain episodes. No orthopnea no paroxysmal nocturnal dyspnea.  On 01/23/2013: This is a 1 month follow up, the patient couldn't afford Colchicine (200$) and didn't use ibuprofen as she doesn't like to take pain killers. She cancelled echo as she  didn't feel it was necessary since she didn't follow treatment. She continues to have the same chest pain. After 3 more weeks the patient states that her pain is on and off every other day, it is positional and resolves with Ibuprofen. She takes 600 mg BID.  On 03/06/2013 : 6 week follow up - her CP has resolved, she gets occasional sharp chest pain. She had her wisdom teeth removed the last week and was started on Naproxen.  05/19/2013 - the patient states that her chest pain has been better, she has been dealing with attacks of tachycardia associated with sharp chest pain which she contributes to anxiety. Her primary care physician prescribed her Ativan and Zoloft that seems to be controlling her symptoms however they are still persistent. She is seeing a therapist. She also states that her primary care physician just found out that her TSH is low and she is scheduled to see an endocrinologist on 07/16/2013. She continues taking ibuprofen and colchicine, and she denies any syncope. No positional chest pain.  Home Medications  Prior to Admission medications   Medication Sig Start Date End Date Taking? Authorizing Provider  HYDROcodone-acetaminophen (NORCO/VICODIN) 5-325 MG per tablet Take 1 tablet by mouth every 6 (six) hours as needed for pain. 10/17/12   Merryl Hacker, MD  ibuprofen (ADVIL,MOTRIN) 600 MG tablet Take 1 tablet (600 mg total) by mouth every 6 (six) hours as needed for pain. 10/17/12   Merryl Hacker, MD  levonorgestrel-ethinyl estradiol (AVIANE,ALESSE,LESSINA) 0.1-20  MG-MCG tablet Take 1 tablet by mouth daily.    Historical Provider, MD    Family History  Family History  Problem Relation Age of Onset  . Anesthesia problems Neg Hx     Social History  History   Social History  . Marital Status: Single    Spouse Name: N/A    Number of Children: N/A  . Years of Education: N/A   Occupational History  .  [employer]   Social History Main Topics  . Smoking status:  Never Smoker   . Smokeless tobacco: Never Used  . Alcohol Use: Yes     Comment: not during preg  . Drug Use: No  . Sexual Activity: Yes    Birth Control/ Protection: Injection   Other Topics Concern  . Not on file   Social History Narrative  . No narrative on file     Review of Systems, as per history of present illness otherwise negative General:  No chills, fever, night sweats or weight changes.  Cardiovascular:  No chest pain, dyspnea on exertion, edema, orthopnea, palpitations, paroxysmal nocturnal dyspnea. Dermatological: No rash, lesions/masses Respiratory: No cough, dyspnea Urologic: No hematuria, dysuria Abdominal:   No nausea, vomiting, diarrhea, bright red blood per rectum, melena, or hematemesis Neurologic:  No visual changes, wkns, changes in mental status. All other systems reviewed and are otherwise negative except as noted above.  Physical Exam  Blood pressure 120/72, pulse 60, weight 203 lb (92.08 kg).  General: Pleasant, NAD Psych: Normal affect. Neuro: Alert and oriented X 3. Moves all extremities spontaneously. HEENT: Normal  Neck: Supple without bruits or JVD. Lungs:  Resp regular and unlabored, CTA. Heart: RRR no s3, s4, or murmurs. no rub Abdomen: Soft, non-tender, non-distended, BS + x 4.  Extremities: No clubbing, cyanosis or edema. DP/PT/Radials 2+ and equal bilaterally.  Accessory Clinical Findings  ECG - sinus rhythm with sinus arrhythmia, 60 beats per minute, no ST-T wave abnormalities, diffuse ST elevations predominantly in the inferolateral leads.  Echocardiogram - 01/16/2013  - Left ventricle: The cavity size was normal. Wall thickness was normal. Systolic function was normal. The estimated ejection fraction was in the range of 55% to 60%. Wall motion was normal; there were no regional wall motion abnormalities. Left ventricular diastolic function parameters were normal. - Pericardium, extracardiac: A small pericardial effusion was  identified. Impressions:  - No prior study for comparison; normal LV function; small pericardial effusion.    Assessment & Plan  28 year old female  1. Atypical chest pain - nonexertional, positional, most probably pericarditis, the rub and symptoms have resolved. ESR, CRP, WBC were normal. A small amount of pericardial effusion is seen on the echocardiogram. The patient will undergo a cardiac MRI 03/31/2013. For now we will continue with ibuprofen 600 mg by mouth 3 times a day.  2. Hyperthyroidism - we will recheck TSH and free T3 and free T4, since her endocrinology appointment is most filled July he might consider starting her on thyroid suppressing medication side effect such as methimazole.  3. Microcytic anemia - started on iron sulfate 325 mg orally twice a day a month ago, repeated Hb 8.9 -->9.5, she is not always complaint to FeS pills, and states that she has been anemic since her teenage years. We recommend to continue FeS.   Followup in 3 months, repeat echocardiogram to reevaluate pericardial effusion.  Dorothy Spark, MD 05/19/2013, 4:21 PM

## 2013-05-20 ENCOUNTER — Telehealth: Payer: Self-pay | Admitting: Cardiology

## 2013-05-20 NOTE — Telephone Encounter (Signed)
Alison Gomez FMLA papers Faxed to [phone],Pt aware and Will pick up Thursday

## 2013-05-22 ENCOUNTER — Other Ambulatory Visit (INDEPENDENT_AMBULATORY_CARE_PROVIDER_SITE_OTHER): Payer: Managed Care, Other (non HMO)

## 2013-05-22 ENCOUNTER — Telehealth: Payer: Self-pay | Admitting: Cardiology

## 2013-05-22 DIAGNOSIS — I309 Acute pericarditis, unspecified: Secondary | ICD-10-CM

## 2013-05-22 DIAGNOSIS — D509 Iron deficiency anemia, unspecified: Secondary | ICD-10-CM

## 2013-05-22 DIAGNOSIS — R079 Chest pain, unspecified: Secondary | ICD-10-CM

## 2013-05-22 DIAGNOSIS — E039 Hypothyroidism, unspecified: Secondary | ICD-10-CM

## 2013-05-22 LAB — TSH: TSH: 0.44 u[IU]/mL (ref 0.35–4.50)

## 2013-05-22 NOTE — Telephone Encounter (Signed)
Pt Picked Up FMLA Paperwork 5.21.15/kdm

## 2013-05-23 LAB — T3: T3, Total: 100.6 ng/dL (ref 80.0–204.0)

## 2013-05-23 LAB — T4: T4, Total: 7.2 ug/dL (ref 5.0–12.5)

## 2013-06-05 ENCOUNTER — Ambulatory Visit (HOSPITAL_COMMUNITY): Payer: Managed Care, Other (non HMO) | Attending: Cardiology | Admitting: Radiology

## 2013-06-05 DIAGNOSIS — I319 Disease of pericardium, unspecified: Secondary | ICD-10-CM | POA: Insufficient documentation

## 2013-06-05 DIAGNOSIS — I309 Acute pericarditis, unspecified: Secondary | ICD-10-CM

## 2013-06-05 DIAGNOSIS — R072 Precordial pain: Secondary | ICD-10-CM

## 2013-06-05 DIAGNOSIS — R079 Chest pain, unspecified: Secondary | ICD-10-CM | POA: Insufficient documentation

## 2013-06-05 NOTE — Progress Notes (Signed)
Echocardiogram performed.  

## 2013-06-06 ENCOUNTER — Telehealth: Payer: Self-pay | Admitting: *Deleted

## 2013-06-06 ENCOUNTER — Encounter: Payer: Self-pay | Admitting: *Deleted

## 2013-06-06 NOTE — Telephone Encounter (Signed)
Called pt to inform her of her recent echo results showing minimal pericardial effusion, no change from the prior echo, per Dr Delton See.  This pts voicemail is not set up and emergency contact's voicemail is full. \ Will further advise this pt through a result letter.  Letter to be sent 06-06-13

## 2013-06-06 NOTE — Telephone Encounter (Signed)
Message copied by Loa Socks on Fri Jun 06, 2013  5:22 PM ------      Message from: Lars Masson      Created: Fri Jun 06, 2013  4:29 PM       Minimal pericardial effusion, no change from the prior echocardiogram , would you let her know?      Thank you,      K ------

## 2013-06-09 ENCOUNTER — Telehealth: Payer: Self-pay | Admitting: Cardiology

## 2013-06-09 NOTE — Telephone Encounter (Signed)
New message     Returning a nurses call from Friday.  She is at work---ok to leave a vm message if she cannot answer

## 2013-06-09 NOTE — Telephone Encounter (Signed)
LVM on confirmed voicemail, as instructed by pt, to tell her that the nurse was contacting pt last Friday to inform her of her recent echo results showing minimal pericardial effusion, no change from the prior echo, per Dr Delton See. Pt should continue with current treatment regimen and if she would like to speak with a nurse to call our office.  Result letter also sent on 06-06-13, to further inform pt.

## 2013-08-01 ENCOUNTER — Other Ambulatory Visit: Payer: Self-pay | Admitting: Obstetrics and Gynecology

## 2013-08-01 DIAGNOSIS — N63 Unspecified lump in unspecified breast: Secondary | ICD-10-CM

## 2013-08-02 ENCOUNTER — Emergency Department (HOSPITAL_BASED_OUTPATIENT_CLINIC_OR_DEPARTMENT_OTHER)
Admission: EM | Admit: 2013-08-02 | Discharge: 2013-08-02 | Disposition: A | Discharge status: Home / Self Care | Payer: Managed Care, Other (non HMO) | Attending: Emergency Medicine | Admitting: Emergency Medicine

## 2013-08-02 ENCOUNTER — Encounter (HOSPITAL_BASED_OUTPATIENT_CLINIC_OR_DEPARTMENT_OTHER): Payer: Self-pay | Admitting: Emergency Medicine

## 2013-08-02 ENCOUNTER — Emergency Department (HOSPITAL_BASED_OUTPATIENT_CLINIC_OR_DEPARTMENT_OTHER): Payer: Managed Care, Other (non HMO)

## 2013-08-02 ENCOUNTER — Other Ambulatory Visit: Payer: Self-pay

## 2013-08-02 DIAGNOSIS — Z8619 Personal history of other infectious and parasitic diseases: Secondary | ICD-10-CM | POA: Insufficient documentation

## 2013-08-02 DIAGNOSIS — R072 Precordial pain: Secondary | ICD-10-CM | POA: Insufficient documentation

## 2013-08-02 DIAGNOSIS — R0789 Other chest pain: Secondary | ICD-10-CM

## 2013-08-02 DIAGNOSIS — M542 Cervicalgia: Secondary | ICD-10-CM | POA: Insufficient documentation

## 2013-08-02 DIAGNOSIS — Z79899 Other long term (current) drug therapy: Secondary | ICD-10-CM | POA: Insufficient documentation

## 2013-08-02 DIAGNOSIS — F410 Panic disorder [episodic paroxysmal anxiety] without agoraphobia: Secondary | ICD-10-CM | POA: Insufficient documentation

## 2013-08-02 MED ORDER — CYCLOBENZAPRINE HCL 5 MG PO TABS: 5.0000 mg | ORAL_TABLET | Freq: Three times a day (TID) | ORAL | Status: DC

## 2013-08-02 MED ORDER — HYDROCODONE-ACETAMINOPHEN 5-325 MG PO TABS
1.0000 | ORAL_TABLET | Freq: Once | ORAL | Status: AC
  Administered 2013-08-02: 1 via ORAL
  Filled 2013-08-02: qty 1

## 2013-08-02 MED ORDER — CYCLOBENZAPRINE HCL 10 MG PO TABS
5.0000 mg | ORAL_TABLET | Freq: Once | ORAL | Status: AC
  Administered 2013-08-02: 5 mg via ORAL
  Filled 2013-08-02: qty 1

## 2013-08-02 MED ORDER — IBUPROFEN 800 MG PO TABS: 800.0000 mg | ORAL_TABLET | Freq: Three times a day (TID) | ORAL | Status: DC

## 2013-08-02 MED ORDER — MORPHINE SULFATE 4 MG/ML IJ SOLN: 4.0000 mg | Freq: Once | INTRAMUSCULAR | Status: DC

## 2013-08-02 MED ORDER — HYDROCODONE-ACETAMINOPHEN 5-325 MG PO TABS: 1.0000 | ORAL_TABLET | ORAL | Status: DC | PRN

## 2013-08-02 NOTE — ED Notes (Signed)
Patient arrived by EMS from work after developing sharp chest pain and now pain to posterior right neck and shoulder.  Has history of panic attacks, hyperventilating on arrival

## 2013-08-02 NOTE — Discharge Instructions (Signed)

## 2013-08-02 NOTE — ED Provider Notes (Signed)
CSN: [id number]     Arrival date & time 08/02/13  1117 History   First MD Initiated Contact with Patient 08/02/13 1204     Chief Complaint  Patient presents with  . Panic Attack   28 yo AA F with a hx of pericarditis presents via EMS for evaluation of neck pain associated with panic attack. 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 She described the pain only as 'an achy pressure'. She denies any associated N/V or diaphoresis. She said her last panic attack was 2 mos ago and that this one feels different. She said her OBGYN found a lump on her right breast at their appointment on Thursday and she says 'this is where the pain starts".  The pain is not positional and does not change with breathing. She is complaining now that she is unable to move her head from side to side due to the pain. She says the pain was initially a 9/10 but has since abated to a 5/10 w/o any intervention.   (Consider location/radiation/quality/duration/timing/severity/associated sxs/prior Treatment) Patient is a 28 y.o. female presenting with chest pain. The history is provided by the patient.  Chest Pain Pain location:  R chest Pain quality: aching, pressure and radiating   Pain radiates to:  R shoulder Pain radiates to the back: yes   Pain severity:  Moderate Onset quality:  Sudden Duration:  3 hours Timing:  Constant Progression:  Improving Chronicity:  New Context: at rest   Associated symptoms: no abdominal pain, no diaphoresis, no fever, no headache, no nausea, no numbness and not vomiting     Past Medical History  Diagnosis Date  . Herpes genitalia   . Panic attack   . Trichomonas   . Previous cesarean delivery affecting pregnancy 01/10/2011  . Chest pain    Past Surgical History  Procedure Laterality Date  . Induced abortion    . Cesarean section  01/10/2011    Procedure: CESAREAN SECTION;   Surgeon: Sherron MondayJody Bovard, MD;  Location: [facility];  Service: Gynecology;  Laterality: N/A;  repeat  c/s  . Tooth extraction     Family History  Problem Relation Age of Onset  . Anesthesia problems Neg Hx    History  Substance Use Topics  . Smoking status: Never Smoker   . Smokeless tobacco: Never Used  . Alcohol Use: Yes     Comment: not during preg   OB History   Grav Para Term Preterm Abortions TAB SAB Ect Mult Living   4 2 2  1 1    2      Review of Systems  Constitutional: Negative for fever, chills and diaphoresis.  Respiratory: Positive for chest tightness.        No reported SOB  Cardiovascular: Positive for chest pain.  Gastrointestinal: Negative for nausea, vomiting and abdominal pain.  Genitourinary: Negative for dysuria and flank pain.  Musculoskeletal: Positive for neck pain and neck stiffness.  Neurological: Negative for syncope, light-headedness, numbness and headaches.      Allergies  Chocolate  Home Medications   Prior to Admission medications   Medication Sig Start Date End Date Taking? Authorizing Provider  colchicine 0.6 MG tablet Take 1 tablet (0.6 mg total) by mouth 2 (two) times daily. 05/19/13   Lars MassonKatarina H Nelson, MD  cyclobenzaprine (FLEXERIL) 5 MG tablet Take 1 tablet (5 mg total)  by mouth 3 (three) times daily. 08/02/13   Shari A Upstill, PA-C  HYDROcodone-acetaminophen (NORCO/VICODIN) 5-325 MG per tablet Take 1 tablet by mouth every 4 (four) hours as needed for moderate pain. 08/02/13   Shari A Upstill, PA-C  ibuprofen (ADVIL,MOTRIN) 600 MG tablet Take 1 tablet (600 mg total) by mouth 3 (three) times daily. For 7 days 03/19/13   Quintella Reichert, MD  ibuprofen (ADVIL,MOTRIN) 800 MG tablet Take 1 tablet (800 mg total) by mouth 3 (three) times daily. 08/02/13   Arnoldo Hooker, PA-C  levonorgestrel-ethinyl estradiol (AVIANE,ALESSE,LESSINA) 0.1-20 MG-MCG tablet Take 1 tablet by mouth daily.    Historical Provider, MD  LORazepam (ATIVAN) 1 MG tablet  05/08/13    Historical Provider, MD  sertraline (ZOLOFT) 100 MG tablet  05/08/13   Historical Provider, MD   BP 104/68  Pulse 60  Temp(Src) 97.6 F (36.4 C) (Oral)  Resp 20  Wt 206 lb (93.441 kg)  SpO2 100%  LMP 08/02/2013 Physical Exam  Constitutional: She is oriented to person, place, and time. She appears well-developed and well-nourished.  HENT:  Head: Normocephalic and atraumatic.  Eyes: Conjunctivae and EOM are normal.  Neck: Trachea normal and phonation normal. Normal carotid pulses and no JVD present. No spinous process tenderness present. Carotid bruit is not present. Decreased range of motion present. No tracheal deviation present. No mass and no thyromegaly present.  Cardiovascular: Normal rate, regular rhythm, normal heart sounds and intact distal pulses.  Exam reveals no gallop and no friction rub.   No murmur heard. Pulmonary/Chest: Effort normal and breath sounds normal. No stridor. No respiratory distress. She has no wheezes. She has no rales. She exhibits no edema, no deformity, no swelling and no retraction.    Speaking clearly now with normal respirations Exhibits R chest wall tenderness   Abdominal: Soft. She exhibits no distension. There is no tenderness.  Musculoskeletal:       Right shoulder: She exhibits no bony tenderness.  ROM limited by pain located over R Trapezius muscle. Pt neck ROM limited secondary to pain.  Lymphadenopathy:    She has no cervical adenopathy.  Neurological: She is alert and oriented to person, place, and time. She has normal strength. She exhibits normal muscle tone.  CN II-XII grossly intact  Skin: Skin is warm and dry. No rash noted. No erythema.    ED Course  Procedures (including critical care time) Labs Review Labs Reviewed - No data to display  Imaging Review Dg Chest 2 View  08/02/2013   CLINICAL DATA:  Right neck pain and stiffness.  EXAM: CHEST  2 VIEW  COMPARISON:  02/06/2013.  FINDINGS: Stable normal sized heart. Clear lungs with  normal vascularity. Mild central peribronchial thickening without significant change. Unremarkable bones.  IMPRESSION: Stable mild chronic bronchitic changes.  No acute abnormality.   Electronically Signed   By: Gordan Payment M.D.   On: 08/02/2013 13:28     EKG Interpretation None      MDM   Diagnosis: Chest Wall Pain  Blood work ordered, but pt resistant to having any needle sticks. Pain was reproducible and seemed MSK in nature and supported by a neg CXR and EKG. Lab work was subsequently canceled    DDX: Muscle Spasm- given the quality of the pain, sudden onset, and the reproducibility of the pain over the R trapezius muscle, this diagnoses has an increased likliehood  Costochondritis- there is moderate pain to palpation over R chest that reproduces the pain, which makes  this more likely  Brachial Plexus Injury: No evidence or history of trauma. Neurovascularly intact and quality/characteristic of pain makes this less likely.   Spinous Process frx: Less likely due to lack of bony tenderness over any cervical or thoracic vertebrae, no evidence of frx on CXR  Rib Frx: No step-offs noted on PE, but exam was limited by pt discomfort. No evidence of frx on CXR.  ACS- given age/demographics of pt and characteristics/timing/scenario of cp, ACS less likely  Vertebral artery dissection- Vitals stable. No signs of perfusion deficit  Carotid Artery dissection-Vitals stable. No signs of perfusion deficit  Thoracic aorta dissection/aneurysm- no evidence on CXR . Vitals stable, No peritoneal signs, cp getting better  Pulmonary Embolus- Vitals stable, no reported SOB  Pneumothorax- No evidence on CXR . Vitals stable, no associated pain with breathing. Pain reproducible with palpation makes this less likely.  Simple Breast Cyst/ Neoplasm- Cannot r/o due to pt limited PE. Pt instructed to keep appt with OB for further evaluation of mass.  Esophogeal rupture- No difficulties tolerating  PO  Pancreatitis- No abdominal pain, no N/V, no Diarrhea, minimal EtOH consumption- less likely dx  Cholecystitis- No focal pain over the abdomen, no N/V, no association of pain with eating.  Zoster- cannot rule out Final diagnoses:  Chest wall pain      Meds given in ED:  Medications  HYDROcodone-acetaminophen (NORCO/VICODIN) 5-325 MG per tablet 1 tablet (1 tablet Oral Given 08/02/13 1425)  cyclobenzaprine (FLEXERIL) tablet 5 mg (5 mg Oral Given 08/02/13 1425)    Discharge Medication List as of 08/02/2013  3:04 PM    START taking these medications   Details  cyclobenzaprine (FLEXERIL) 5 MG tablet Take 1 tablet (5 mg total) by mouth 3 (three) times daily., Starting 08/02/2013, Until Discontinued, Print    HYDROcodone-acetaminophen (NORCO/VICODIN) 5-325 MG per tablet Take 1 tablet by mouth every 4 (four) hours as needed for moderate pain., Starting 08/02/2013, Until Discontinued, Print    !! ibuprofen (ADVIL,MOTRIN) 800 MG tablet Take 1 tablet (800 mg total) by mouth 3 (three) times daily., Starting 08/02/2013, Until Discontinued, Print     !! - Potential duplicate medications found. Please discuss with provider.       Earle Gell Manhattan, PA-C 08/07/13 [phone]

## 2013-08-07 ENCOUNTER — Ambulatory Visit
Admission: RE | Admit: 2013-08-07 | Discharge: 2013-08-07 | Disposition: A | Discharge status: Home / Self Care | Payer: Managed Care, Other (non HMO) | Source: Ambulatory Visit | Attending: Obstetrics and Gynecology | Admitting: Obstetrics and Gynecology

## 2013-08-07 DIAGNOSIS — N63 Unspecified lump in unspecified breast: Secondary | ICD-10-CM

## 2013-08-07 NOTE — ED Provider Notes (Signed)
Medical screening examination/treatment/procedure(s) were performed by non-physician practitioner and as supervising physician I was immediately available for consultation/collaboration.   EKG Interpretation   Date/Time:  Saturday August 02 2013 13:20:05 EDT Ventricular Rate:  56 PR Interval:  170 QRS Duration: 86 QT Interval:  404 QTC Calculation: 389 R Axis:   75 Text Interpretation:  Sinus bradycardia with sinus arrhythmia Otherwise  normal ECG similar to prior EKGs Confirmed by Sakura Denis  MD, Morgan Keinath ([id number])  on 08/02/2013 4:10:43 PM        Rolan BuccoMelanie Jakya Dovidio, MD 08/07/13 1451

## 2013-08-25 ENCOUNTER — Other Ambulatory Visit: Payer: Self-pay | Admitting: *Deleted

## 2013-08-28 ENCOUNTER — Ambulatory Visit: Payer: Managed Care, Other (non HMO) | Admitting: Cardiology

## 2013-10-20 ENCOUNTER — Encounter: Payer: Self-pay | Admitting: Cardiology

## 2013-10-28 ENCOUNTER — Encounter: Payer: Self-pay | Admitting: Cardiology

## 2013-10-28 ENCOUNTER — Ambulatory Visit (INDEPENDENT_AMBULATORY_CARE_PROVIDER_SITE_OTHER): Payer: Managed Care, Other (non HMO) | Admitting: Cardiology

## 2013-10-28 VITALS — BP 126/60 | HR 84 | Ht 70.0 in | Wt 219.0 lb

## 2013-10-28 DIAGNOSIS — R079 Chest pain, unspecified: Secondary | ICD-10-CM

## 2013-10-28 DIAGNOSIS — F419 Anxiety disorder, unspecified: Secondary | ICD-10-CM | POA: Insufficient documentation

## 2013-10-28 DIAGNOSIS — K219 Gastro-esophageal reflux disease without esophagitis: Secondary | ICD-10-CM | POA: Insufficient documentation

## 2013-10-28 DIAGNOSIS — M940 Chondrocostal junction syndrome [Tietze]: Secondary | ICD-10-CM | POA: Insufficient documentation

## 2013-10-28 MED ORDER — IBUPROFEN 200 MG PO CAPS: 200.0000 mg | ORAL_CAPSULE | Freq: Three times a day (TID) | ORAL | Status: DC

## 2013-10-28 MED ORDER — ESOMEPRAZOLE MAGNESIUM 40 MG PO CPDR: 40.0000 mg | DELAYED_RELEASE_CAPSULE | Freq: Every day | ORAL | Status: DC

## 2013-10-28 NOTE — Patient Instructions (Addendum)
Your physician has recommended you make the following change in your medication:    IBUPROFEN 200 MG THREE TIMES DAILY  NEXIUM 40 MG ONCE DAILY   Your physician recommends that you schedule a follow-up appointment in: AS NEEDED WITH DR Delton SeeNELSON

## 2013-10-28 NOTE — Progress Notes (Signed)
Patient ID: EMOREE SASAKI, female   DOB: Dec 23, 1985, 28 y.o.   MRN: [id number]    Patient Name: Alison Gomez Date of Encounter: 10/28/2013  Primary Care Provider:  Reginia Naas, MD Primary Cardiologist: Dorothy Spark  Patient Profile  Pericardial effusion on CT  Problem List   Past Medical History  Diagnosis Date  . Herpes genitalia   . Panic attack   . Trichomonas   . Previous cesarean delivery affecting pregnancy 01/10/2011  . Chest pain    Past Surgical History  Procedure Laterality Date  . Induced abortion    . Cesarean section  01/10/2011    Procedure: CESAREAN SECTION;  Surgeon: Thornell Sartorius, MD;  Location: [facility];  Service: Gynecology;  Laterality: N/A;  repeat  c/s  . Tooth extraction      Allergies  Allergies  Allergen Reactions  . Chocolate Anaphylaxis   HPI  This is a very pleasant 28 year old female who was recently seen in the ER for profound vaginal bleeding that was followed after removal of her IUD because of vaginal perforation. As a part of bleeding workup abdominal CT was performed with limited view of heart. A small amount of per cardial effusion was described. The patient states that for the last couple of months she has experienced chest pain, it is non-exertional, it can happen anytime of the day, and is constant, radiating to her back. The pain feels sharp retrosternal, and changes with the position. The patient also states that she has history of anxiety, and then she had episodes of chest pain she also starts hyperventilating and eventually feel short of breath. No palpitations no dizziness, and no syncopal episode. The patient doesn't remember any viral-like illness prior to starting these chest pain episodes. No orthopnea no paroxysmal nocturnal dyspnea.  On 01/23/2013: This is a 1 month follow up, the patient couldn't afford Colchicine (200$) and didn't use ibuprofen as she doesn't like to take pain killers. She cancelled echo as she  didn't feel it was necessary since she didn't follow treatment. She continues to have the same chest pain. After 3 more weeks the patient states that her pain is on and off every other day, it is positional and resolves with Ibuprofen. She takes 600 mg BID.  On 03/06/2013 : 6 week follow up - her CP has resolved, she gets occasional sharp chest pain. She had her wisdom teeth removed the last week and was started on Naproxen.  05/19/2013 - the patient states that her chest pain has been better, she has been dealing with attacks of tachycardia associated with sharp chest pain which she contributes to anxiety. Her primary care physician prescribed her Ativan and Zoloft that seems to be controlling her symptoms however they are still persistent. She is seeing a therapist. She also states that her primary care physician just found out that her TSH is low and she is scheduled to see an endocrinologist on 07/16/2013. She continues taking ibuprofen and colchicine, and she denies any syncope. No positional chest pain.  10/28/2013 - the patient is coming after 5 months, she states she has been having a lot of anxiety attack since 3 days ago started to develop chest pain on the right side of her sternum that is palpable and gets first on deep breathing. She has been seeing a therapist and is scheduled to see up psychiatrist next week she uses Xanax when necessary. She states she hasn't been using colchicine as it doesn't work.  Home Medications  Prior to Admission medications   Medication Sig Start Date End Date Taking? Authorizing Provider  HYDROcodone-acetaminophen (NORCO/VICODIN) 5-325 MG per tablet Take 1 tablet by mouth every 6 (six) hours as needed for pain. 10/17/12   Merryl Hacker, MD  ibuprofen (ADVIL,MOTRIN) 600 MG tablet Take 1 tablet (600 mg total) by mouth every 6 (six) hours as needed for pain. 10/17/12   Merryl Hacker, MD  levonorgestrel-ethinyl estradiol (AVIANE,ALESSE,LESSINA) 0.1-20  MG-MCG tablet Take 1 tablet by mouth daily.    Historical Provider, MD    Family History  Family History  Problem Relation Age of Onset  . Anesthesia problems Neg Hx     Social History  History   Social History  . Marital Status: Single    Spouse Name: N/A    Number of Children: N/A  . Years of Education: N/A   Occupational History  .  At [employer]   Social History Main Topics  . Smoking status: Never Smoker   . Smokeless tobacco: Never Used  . Alcohol Use: Yes     Comment: not during preg  . Drug Use: No  . Sexual Activity: Yes    Birth Control/ Protection: Injection   Other Topics Concern  . Not on file   Social History Narrative  . No narrative on file     Review of Systems, as per history of present illness otherwise negative General:  No chills, fever, night sweats or weight changes.  Cardiovascular:  No chest pain, dyspnea on exertion, edema, orthopnea, palpitations, paroxysmal nocturnal dyspnea. Dermatological: No rash, lesions/masses Respiratory: No cough, dyspnea Urologic: No hematuria, dysuria Abdominal:   No nausea, vomiting, diarrhea, bright red blood per rectum, melena, or hematemesis Neurologic:  No visual changes, wkns, changes in mental status. All other systems reviewed and are otherwise negative except as noted above.  Physical Exam  Blood pressure 126/60, pulse 84, height '5\' 10"'  (1.778 m), weight 219 lb (99.338 kg).  General: Pleasant, NAD Psych: Normal affect. Neuro: Alert and oriented X 3. Moves all extremities spontaneously. HEENT: Normal  Neck: Supple without bruits or JVD. Lungs:  Resp regular and unlabored, CTA. Heart: RRR no s3, s4, or murmurs. no rub Abdomen: Soft, non-tender, non-distended, BS + x 4.  Extremities: No clubbing, cyanosis or edema. DP/PT/Radials 2+ and equal bilaterally.  Accessory Clinical Findings  ECG - sinus rhythm with sinus arrhythmia, 60 beats per minute, no ST-T wave abnormalities, diffuse ST elevations  predominantly in the inferolateral leads.  Echocardiogram - 01/16/2013  - Left ventricle: The cavity size was normal. Wall thickness was normal. Systolic function was normal. The estimated ejection fraction was in the range of 55% to 60%. Wall motion was normal; there were no regional wall motion abnormalities. Left ventricular diastolic function parameters were normal. - Pericardium, extracardiac: A small pericardial effusion was identified. Impressions:  - No prior study for comparison; normal LV function; small pericardial effusion.    Assessment & Plan  28 year old female  1. Atypical chest pain - nonexertional, positional, most probably pericarditis, the rub and symptoms have resolved. ESR, CRP, WBC were normal. A small amount of pericardial effusion is seen on the echocardiogram and it was unchanged on echocardiogram in June 2015. Her MRI showed no evidence for acute pericarditis.  Her today's presentation seems to be related to costochondritis think this can be secondary to caring her kids age 77 and 18 and also what hyperventilation with her frequent anxiety attacks. Her mother is requesting morphine, she is explained that  in costochondritis that is inflammatory in origin morphine won't help but rather ibuprofen.  She is also describing chest burns in horizontal position she is advised to buy over-the-counter Nexium 40 mg daily.  2. Hyperthyroidism - rechecked TSH and free T3 and free T4 all within normal limits. She follows with endocrinology.  3. Microcytic anemia - started on iron sulfate 325 mg orally twice a day a month ago, repeated Hb 8.9 -->9.5, she is not always complaint to FeS pills, and states that she has been anemic since her teenage years. We recommend to continue FeS.   Followup as needed.Dorothy Spark, MD 10/28/2013, 12:29 PM

## 2013-11-03 ENCOUNTER — Encounter: Payer: Self-pay | Admitting: Cardiology

## 2013-12-01 ENCOUNTER — Telehealth: Payer: Self-pay | Admitting: *Deleted

## 2013-12-01 NOTE — Telephone Encounter (Signed)
Called patient and left VM that PA for Pantoprazole had been approved for 36 months and would be ready for pickup at pharmacy this afternoon.

## 2014-04-21 ENCOUNTER — Telehealth: Payer: Self-pay | Admitting: Cardiology

## 2014-04-21 NOTE — Telephone Encounter (Signed)
LMTCB

## 2014-04-21 NOTE — Telephone Encounter (Signed)
New Message       Pt calling stating she needs to be seen by Dr. Delton SeeNelson sooner than her scheduled appt but will not disclose what issues she is having just that she has some chest issues. Please call back and advise.

## 2014-04-21 NOTE — Telephone Encounter (Signed)
Follow up      Returning [name]'s call.  Pt is on lunch break until 5:30

## 2014-04-22 NOTE — Telephone Encounter (Signed)
Left message for the pt to call back, with no return call back. Will close note and defer back if pt returns call back.

## 2014-05-15 ENCOUNTER — Ambulatory Visit (INDEPENDENT_AMBULATORY_CARE_PROVIDER_SITE_OTHER): Payer: BLUE CROSS/BLUE SHIELD | Admitting: Cardiology

## 2014-05-15 ENCOUNTER — Encounter: Payer: Self-pay | Admitting: Cardiology

## 2014-05-15 VITALS — BP 112/76 | HR 70 | Ht 70.0 in | Wt 229.0 lb

## 2014-05-15 DIAGNOSIS — R072 Precordial pain: Secondary | ICD-10-CM

## 2014-05-15 DIAGNOSIS — I309 Acute pericarditis, unspecified: Secondary | ICD-10-CM | POA: Diagnosis not present

## 2014-05-15 DIAGNOSIS — D509 Iron deficiency anemia, unspecified: Secondary | ICD-10-CM | POA: Diagnosis not present

## 2014-05-15 MED ORDER — COLCHICINE 0.6 MG PO TABS: 0.6000 mg | ORAL_TABLET | Freq: Two times a day (BID) | ORAL | Status: DC

## 2014-05-15 MED ORDER — IBUPROFEN 200 MG PO CAPS: 200.0000 mg | ORAL_CAPSULE | Freq: Three times a day (TID) | ORAL | Status: DC

## 2014-05-15 NOTE — Patient Instructions (Signed)
Medication Instructions:   START TAKING IBUPROFEN 200 MG THREE TIMES DAILY FOR 2 WEEKS ONLY  START TAKING COLCHICINE 0.6 MG TWICE DAILY FOR 2 MONTHS ONLY    Labwork:  3  MONTHS---PRIOR TO YOUR 2 MONTH FOLLOW-UP APPOINTMENT WITH DR Delton SeeNELSON TO CHECK A BMET AND CBC W DIFF      Follow-Up:  3 MONTHS WITH DR NELSON---PLEASE HAVE YOUR LABS DONE PRIOR TO THIS APPOINTMENT

## 2014-05-15 NOTE — Progress Notes (Signed)
Patient ID: Alison SayerValinda C Cameron, female   DOB: 13-Nov-1985, 29 y.o.   MRN: [id number]    Patient Name: Alison Gomez Date of Encounter: 05/15/2014  Primary Care Provider:  Allean FoundSMITH,CANDACE THIELE, MD Primary Cardiologist: Lars MassonNELSON, Sherice Ijames H  Patient Profile  Pericardial effusion on CT  Problem List   Past Medical History  Diagnosis Date  . Herpes genitalia   . Panic attack   . Trichomonas   . Previous cesarean delivery affecting pregnancy 01/10/2011  . Chest pain    Past Surgical History  Procedure Laterality Date  . Induced abortion    . Cesarean section  01/10/2011    Procedure: CESAREAN SECTION;  Surgeon: Sherron MondayJody Bovard, MD;  Location: [facility];  Service: Gynecology;  Laterality: N/A;  repeat  c/s  . Tooth extraction      Allergies  Allergies  Allergen Reactions  . Chocolate Anaphylaxis   Chief complain: chest pain   HPI  This is a very pleasant 29 year old female who was recently seen in the ER for profound vaginal bleeding that was followed after removal of her IUD because of vaginal perforation. As a part of bleeding workup abdominal CT was performed with limited view of heart. A small amount of per cardial effusion was described. The patient states that for the last couple of months she has experienced chest pain, it is non-exertional, it can happen anytime of the day, and is constant, radiating to her back. The pain feels sharp retrosternal, and changes with the position. The patient also states that she has history of anxiety, and then she had episodes of chest pain she also starts hyperventilating and eventually feel short of breath. No palpitations no dizziness, and no syncopal episode. The patient doesn't remember any viral-like illness prior to starting these chest pain episodes. No orthopnea no paroxysmal nocturnal dyspnea.  On 01/23/2013: This is a 1 month follow up, the patient couldn't afford Colchicine (200$) and didn't use ibuprofen as she doesn't like to take pain  killers. She cancelled echo as she didn't feel it was necessary since she didn't follow treatment. She continues to have the same chest pain. After 3 more weeks the patient states that her pain is on and off every other day, it is positional and resolves with Ibuprofen. She takes 600 mg BID.  On 03/06/2013 : 6 week follow up - her CP has resolved, she gets occasional sharp chest pain. She had her wisdom teeth removed the last week and was started on Naproxen.  05/19/2013 - the patient states that her chest pain has been better, she has been dealing with attacks of tachycardia associated with sharp chest pain which she contributes to anxiety. Her primary care physician prescribed her Ativan and Zoloft that seems to be controlling her symptoms however they are still persistent. She is seeing a therapist. She also states that her primary care physician just found out that her TSH is low and she is scheduled to see an endocrinologist on 07/16/2013. She continues taking ibuprofen and colchicine, and she denies any syncope. No positional chest pain.  10/28/2013 - the patient is coming after 5 months, she states she has been having a lot of anxiety attack since 3 days ago started to develop chest pain on the right side of her sternum that is palpable and gets first on deep breathing. She has been seeing a therapist and is scheduled to see up psychiatrist next week she uses Xanax when necessary. She states she hasn't been using colchicine as  it doesn't work.  05/15/2014 - the patient is coming after 6 months, she felt well at the last visit and stopped using colchicine in 10/15, she has developed sharp chest pains on inspiration and while laying in bed. It has started several weeks ago. No DOE. She will experience sharp pains on movements and change in body position.  Home Medications  Prior to Admission medications   Medication Sig Start Date End Date Taking? Authorizing Provider  HYDROcodone-acetaminophen  (NORCO/VICODIN) 5-325 MG per tablet Take 1 tablet by mouth every 6 (six) hours as needed for pain. 10/17/12   Shon Batonourtney F Horton, MD  ibuprofen (ADVIL,MOTRIN) 600 MG tablet Take 1 tablet (600 mg total) by mouth every 6 (six) hours as needed for pain. 10/17/12   Shon Batonourtney F Horton, MD  levonorgestrel-ethinyl estradiol (AVIANE,ALESSE,LESSINA) 0.1-20 MG-MCG tablet Take 1 tablet by mouth daily.    Historical Provider, MD    Family History  Family History  Problem Relation Age of Onset  . Anesthesia problems Neg Hx     Social History  History   Social History  . Marital Status: Single    Spouse Name: N/A  . Number of Children: N/A  . Years of Education: N/A   Occupational History  .  At [employer]   Social History Main Topics  . Smoking status: Never Smoker   . Smokeless tobacco: Never Used  . Alcohol Use: Yes     Comment: not during preg  . Drug Use: No  . Sexual Activity: Yes    Birth Control/ Protection: Injection   Other Topics Concern  . Not on file   Social History Narrative    Review of Systems, as per history of present illness otherwise negative General:  No chills, fever, night sweats or weight changes.  Cardiovascular:  No chest pain, dyspnea on exertion, edema, orthopnea, palpitations, paroxysmal nocturnal dyspnea. Dermatological: No rash, lesions/masses Respiratory: No cough, dyspnea Urologic: No hematuria, dysuria Abdominal:   No nausea, vomiting, diarrhea, bright red blood per rectum, melena, or hematemesis Neurologic:  No visual changes, wkns, changes in mental status. All other systems reviewed and are otherwise negative except as noted above.  Physical Exam BP 112/76 mmHg, HR 70 BPM, W 229 lbs There were no vitals taken for this visit.  General: Pleasant, NAD Psych: Normal affect. Neuro: Alert and oriented X 3. Moves all extremities spontaneously. HEENT: Normal  Neck: Supple without bruits or JVD. Lungs:  Resp regular and unlabored, CTA. Heart: RRR no  s3, s4, or murmurs. mild rub Abdomen: Soft, non-tender, non-distended, BS + x 4.  Extremities: No clubbing, cyanosis or edema. DP/PT/Radials 2+ and equal bilaterally.  Accessory Clinical Findings  ECG - sinus rhythm with sinus arrhythmia, 60 beats per minute, no ST-T wave abnormalities, diffuse ST elevations predominantly in the inferolateral leads.  Echocardiogram - 01/16/2013  - Left ventricle: The cavity size was normal. Wall thickness was normal. Systolic function was normal. The estimated ejection fraction was in the range of 55% to 60%. Wall motion was normal; there were no regional wall motion abnormalities. Left ventricular diastolic function parameters were normal. - Pericardium, extracardiac: A small pericardial effusion was identified. Impressions:  - No prior study for comparison; normal LV function; small pericardial effusion.    Assessment & Plan  29 year old female  1. Recurrent acute pericarditis, she now has rub again, we will start low dose ibuprofen for 2 weeks only and 2 months course of colchicine as she also has anemia.   2. Hyperthyroidism -  rechecked TSH and free T3 and free T4 all within normal limits. She follows with endocrinology.  3. Microcytic anemia - started on iron sulfate 325 mg orally twice a day a month ago, repeated Hb 8.9 -->9.5, she is not always complaint to FeS pills, and states that she has been anemic since her teenage years. We recommend to continue FeS.   Followup in 2 months, check CBC and BMP prior to the next visit.  Lars Masson, MD 05/15/2014, 2:43 PM

## 2014-05-21 ENCOUNTER — Telehealth: Payer: Self-pay | Admitting: Cardiology

## 2014-05-21 DIAGNOSIS — I309 Acute pericarditis, unspecified: Secondary | ICD-10-CM

## 2014-05-21 DIAGNOSIS — R079 Chest pain, unspecified: Secondary | ICD-10-CM

## 2014-05-21 NOTE — Telephone Encounter (Signed)
Received direct call from scheduling from pt.  Pt with pericarditis. Pt reports she had chest pain a couple of weeks ago and saw Dr. Delton SeeNelson on May 15, 2014.  At time of visit with Dr. Delton SeeNelson pt reports pain had improved some.  Since this past Sunday has been experiencing increased chest pain. Worse with deep breathing. Pain in left shoulder blade area. Worsens with movement. Cannot lie on left side. Describes as aching to sharp. Gradually increases throughout the day. Taking colchicine. Taking Ibuprofen 400 mg three times daily. Had to leave work last night due to pain and is very concerned she will lose her job. Does not want to go to ED. She is going to try to go to work this AM.  I told pt she should not be driving while in pain.  Will review with Dr. Delton SeeNelson.  Pt instructed to go to ED if symptoms worsen.

## 2014-05-21 NOTE — Telephone Encounter (Signed)
Pt c/o of Chest Pain: STAT if CP now or developed within 24 hours  1. Are you having CP right now? Yes  2. Are you experiencing any other symptoms (ex. SOB, nausea, vomiting, sweating)? SOB  3. How long have you been experiencing CP? 3days  4. Is your CP continuous or coming and going? continuous  5. Have you taken Nitroglycerin? No ?

## 2014-05-22 NOTE — Telephone Encounter (Signed)
lmtcb

## 2014-05-22 NOTE — Telephone Encounter (Signed)
Pt calling back.  States her pain seem to be some better today.  Is taking her Ibuprofen 200 mg every 4 hrs and is taking the Colchicine 0.6 mg twice a day.  States when she saw Dr. Delton SeeNelson last year she recommended doing an echo which she states she never scheduled.  Wants to know if she should have one done since she is having the pain again.  Also states she wants to know if Dr. Delton SeeNelson will fill out FMLA papers since she has been out of work so much. Spoke w/Dr. Delton SeeNelson who advises that an echo can be ordered and depending on the results whether she will fill out FMLA papers. Lm on her voice mail that someone will call her for the echo appointment.

## 2014-05-22 NOTE — Telephone Encounter (Signed)
F/u    Pt stated she is still having chest pain and a nurse was suppose to call her back and she haven't heard back.

## 2014-05-26 NOTE — Telephone Encounter (Signed)
Pt has a scheduled echo for 06/02/14 at 0930.

## 2014-05-28 ENCOUNTER — Telehealth: Payer: Self-pay

## 2014-05-28 NOTE — Telephone Encounter (Signed)
Left second message to call back.

## 2014-05-28 NOTE — Telephone Encounter (Signed)
i called patient to verify insurance when pt expressed that she has continued to have chest pain and would like to speak to someone I told patient I will forward message to our triage staff Patient can be reached on phone  Number that we have in the system.. I explained the patients concerns with Satira AnisPam Pate RN  And gave her all the patients information

## 2014-05-28 NOTE — Telephone Encounter (Addendum)
Patient complaining of SOB, chest pain when inhaling, and panic attack.  Patient has an on going history of pericarditis and Dr. Lindaann SloughNelson's treatment for this is taking colchicine and ibuprofen. Patient is scheduled for echo next week. Dr. Delton SeeNelson does not have anything else to add to her treatment plan at this time. Called patient back to inform her of this. Left message for patient to call back.

## 2014-06-02 ENCOUNTER — Ambulatory Visit (HOSPITAL_COMMUNITY): Payer: BLUE CROSS/BLUE SHIELD | Attending: Cardiovascular Disease

## 2014-06-02 ENCOUNTER — Other Ambulatory Visit: Payer: Self-pay

## 2014-06-02 DIAGNOSIS — R079 Chest pain, unspecified: Secondary | ICD-10-CM

## 2014-06-02 DIAGNOSIS — I517 Cardiomegaly: Secondary | ICD-10-CM | POA: Insufficient documentation

## 2014-06-02 DIAGNOSIS — I309 Acute pericarditis, unspecified: Secondary | ICD-10-CM

## 2014-06-02 DIAGNOSIS — I313 Pericardial effusion (noninflammatory): Secondary | ICD-10-CM | POA: Insufficient documentation

## 2014-06-02 DIAGNOSIS — I071 Rheumatic tricuspid insufficiency: Secondary | ICD-10-CM | POA: Diagnosis not present

## 2014-06-02 HISTORY — PX: TRANSTHORACIC ECHOCARDIOGRAM: SHX275

## 2014-07-10 ENCOUNTER — Encounter (HOSPITAL_COMMUNITY): Payer: Self-pay

## 2014-07-10 ENCOUNTER — Emergency Department (HOSPITAL_COMMUNITY): Payer: BLUE CROSS/BLUE SHIELD

## 2014-07-10 ENCOUNTER — Emergency Department (HOSPITAL_COMMUNITY)
Admission: EM | Admit: 2014-07-10 | Discharge: 2014-07-10 | Disposition: A | Discharge status: Home / Self Care | Payer: BLUE CROSS/BLUE SHIELD | Attending: Emergency Medicine | Admitting: Emergency Medicine

## 2014-07-10 DIAGNOSIS — F410 Panic disorder [episodic paroxysmal anxiety] without agoraphobia: Secondary | ICD-10-CM | POA: Diagnosis not present

## 2014-07-10 DIAGNOSIS — Z8619 Personal history of other infectious and parasitic diseases: Secondary | ICD-10-CM | POA: Insufficient documentation

## 2014-07-10 DIAGNOSIS — R05 Cough: Secondary | ICD-10-CM | POA: Insufficient documentation

## 2014-07-10 DIAGNOSIS — R52 Pain, unspecified: Secondary | ICD-10-CM | POA: Diagnosis present

## 2014-07-10 DIAGNOSIS — M791 Myalgia, unspecified site: Secondary | ICD-10-CM

## 2014-07-10 DIAGNOSIS — R197 Diarrhea, unspecified: Secondary | ICD-10-CM | POA: Diagnosis not present

## 2014-07-10 LAB — BASIC METABOLIC PANEL
Anion gap: 8 (ref 5–15)
BUN: 9 mg/dL (ref 6–20)
CO2: 25 mmol/L (ref 22–32)
Calcium: 9.2 mg/dL (ref 8.9–10.3)
Chloride: 108 mmol/L (ref 101–111)
Creatinine, Ser: 0.7 mg/dL (ref 0.44–1.00)
GLUCOSE: 78 mg/dL (ref 65–99)
Potassium: 5.2 mmol/L — ABNORMAL HIGH (ref 3.5–5.1)
Sodium: 141 mmol/L (ref 135–145)

## 2014-07-10 LAB — I-STAT CHEM 8, ED
BUN: 6 mg/dL (ref 6–20)
CALCIUM ION: 1.19 mmol/L (ref 1.12–1.23)
CHLORIDE: 106 mmol/L (ref 101–111)
Creatinine, Ser: 0.5 mg/dL (ref 0.44–1.00)
GLUCOSE: 81 mg/dL (ref 65–99)
HCT: 34 % — ABNORMAL LOW (ref 36.0–46.0)
Hemoglobin: 11.6 g/dL — ABNORMAL LOW (ref 12.0–15.0)
Potassium: 3.6 mmol/L (ref 3.5–5.1)
Sodium: 141 mmol/L (ref 135–145)
TCO2: 21 mmol/L (ref 0–100)

## 2014-07-10 LAB — CBC WITH DIFFERENTIAL/PLATELET
BASOS PCT: 0 % (ref 0–1)
Basophils Absolute: 0 10*3/uL (ref 0.0–0.1)
Eosinophils Absolute: 0.2 10*3/uL (ref 0.0–0.7)
Eosinophils Relative: 3 % (ref 0–5)
HCT: 30.2 % — ABNORMAL LOW (ref 36.0–46.0)
HEMOGLOBIN: 9.6 g/dL — AB (ref 12.0–15.0)
LYMPHS ABS: 2.4 10*3/uL (ref 0.7–4.0)
LYMPHS PCT: 39 % (ref 12–46)
MCH: 26.2 pg (ref 26.0–34.0)
MCHC: 31.8 g/dL (ref 30.0–36.0)
MCV: 82.5 fL (ref 78.0–100.0)
MONOS PCT: 6 % (ref 3–12)
Monocytes Absolute: 0.4 10*3/uL (ref 0.1–1.0)
NEUTROS PCT: 52 % (ref 43–77)
Neutro Abs: 3.2 10*3/uL (ref 1.7–7.7)
Platelets: 354 10*3/uL (ref 150–400)
RBC: 3.66 MIL/uL — AB (ref 3.87–5.11)
RDW: 19.1 % — ABNORMAL HIGH (ref 11.5–15.5)
WBC: 6.1 10*3/uL (ref 4.0–10.5)

## 2014-07-10 LAB — I-STAT TROPONIN, ED: Troponin i, poc: 0 ng/mL (ref 0.00–0.08)

## 2014-07-10 MED ORDER — SODIUM CHLORIDE 0.9 % IV BOLUS (SEPSIS)
1000.0000 mL | Freq: Once | INTRAVENOUS | Status: AC
  Administered 2014-07-10: 1000 mL via INTRAVENOUS

## 2014-07-10 MED ORDER — KETOROLAC TROMETHAMINE 30 MG/ML IJ SOLN
30.0000 mg | Freq: Once | INTRAMUSCULAR | Status: AC
  Administered 2014-07-10: 30 mg via INTRAVENOUS
  Filled 2014-07-10: qty 1

## 2014-07-10 NOTE — ED Notes (Signed)
Pt presents with c/o generalized body aches. Pt reports she has chills, body aches, a headache, cough, pain in her chest and around her rib cage, and shortness of breath. Pt is no acute distress, talking in complete sentences. Pt reports she is anemic and her doctor wanted her to have a check-up and assure that her hgb was not too low.

## 2014-07-10 NOTE — ED Provider Notes (Signed)
CSN: [id number]     Arrival date & time 07/10/14  1200 History   First MD Initiated Contact with Patient 07/10/14 1211     Chief Complaint  Patient presents with  . Generalized Body Aches     (Consider location/radiation/quality/duration/timing/severity/associated sxs/prior Treatment) Patient is a 29 y.o. female presenting with general illness.  Illness Location:  Generalized Quality:  Myalgias, cough, diarrhea Severity:  Moderate Onset quality:  Gradual Duration:  3 days Timing:  Constant Progression:  Worsening Chronicity:  New Context:  +sick contact with URI Relieved by:  Nothing Associated symptoms: chest pain (chronically) and diarrhea   Associated symptoms: no fever, no nausea, no shortness of breath and no vomiting     Past Medical History  Diagnosis Date  . Herpes genitalia   . Panic attack   . Trichomonas   . Previous cesarean delivery affecting pregnancy 01/10/2011  . Chest pain    Past Surgical History  Procedure Laterality Date  . Induced abortion    . Cesarean section  01/10/2011    Procedure: CESAREAN SECTION;  Surgeon: Sherron MondayJody Bovard, MD;  Location: [facility];  Service: Gynecology;  Laterality: N/A;  repeat  c/s  . Tooth extraction     Family History  Problem Relation Age of Onset  . Anesthesia problems Neg Hx    History  Substance Use Topics  . Smoking status: Never Smoker   . Smokeless tobacco: Never Used  . Alcohol Use: Yes     Comment: not during preg   OB History    Gravida Para Term Preterm AB TAB SAB Ectopic Multiple Living   4 2 2  1 1    2      Review of Systems  Constitutional: Negative for fever.  Respiratory: Negative for shortness of breath.   Cardiovascular: Positive for chest pain (chronically).  Gastrointestinal: Positive for diarrhea. Negative for nausea and vomiting.  All other systems reviewed and are negative.     Allergies  Chocolate  Home Medications   Prior to Admission medications   Medication Sig Start Date End Date  Taking? Authorizing Provider  ferrous sulfate 325 (65 FE) MG tablet TK 1 T PO TID 05/07/14  Yes Historical Provider, MD  ibuprofen (ADVIL,MOTRIN) 200 MG tablet Take 200 mg by mouth every 6 (six) hours as needed for moderate pain.   Yes Historical Provider, MD  LORazepam (ATIVAN) 1 MG tablet Take 1 mg by mouth 2 (two) times daily as needed for anxiety.  05/08/13  Yes Historical Provider, MD  colchicine 0.6 MG tablet Take 1 tablet (0.6 mg total) by mouth 2 (two) times daily. Take for 2 months only Patient not taking: Reported on 07/10/2014 05/15/14   Lars MassonKatarina H Nelson, MD  Ibuprofen ([employer] IBUPROFEN) 200 MG CAPS Take 1 capsule (200 mg total) by mouth 3 (three) times daily. Take for 2 weeks only Patient not taking: Reported on 07/10/2014 05/15/14   Lars MassonKatarina H Nelson, MD   BP 157/96 mmHg  Pulse 53  Temp(Src) 98.3 F (36.8 C) (Oral)  Resp 18  SpO2 100%  LMP 07/06/2014 (LMP Unknown) Physical Exam  Constitutional: She is oriented to person, place, and time. She appears well-developed and well-nourished.  HENT:  Head: Normocephalic and atraumatic.  Right Ear: External ear normal.  Left Ear: External ear normal.  Eyes: Conjunctivae and EOM are normal. Pupils are equal, round, and reactive to light.  Neck: Normal range of motion. Neck supple.  Cardiovascular: Normal rate, regular rhythm, normal heart sounds and intact distal  pulses.   Pulmonary/Chest: Effort normal and breath sounds normal.  Abdominal: Soft. Bowel sounds are normal. There is no tenderness.  Musculoskeletal: Normal range of motion.  Neurological: She is alert and oriented to person, place, and time.  Skin: Skin is warm and dry.  Vitals reviewed.   ED Course  Procedures (including critical care time) Labs Review Labs Reviewed  BASIC METABOLIC PANEL - Abnormal; Notable for the following:    Potassium 5.2 (*)    All other components within normal limits  CBC WITH DIFFERENTIAL/PLATELET - Abnormal; Notable for the following:    RBC 3.66  (*)    Hemoglobin 9.6 (*)    HCT 30.2 (*)    RDW 19.1 (*)    All other components within normal limits  I-STAT CHEM 8, ED - Abnormal; Notable for the following:    Hemoglobin 11.6 (*)    HCT 34.0 (*)    All other components within normal limits  CBC WITH DIFFERENTIAL/PLATELET  I-STAT TROPOININ, ED    Imaging Review Dg Chest 2 View  07/10/2014   CLINICAL DATA:  Chills, body aches, headache, cough and chest pain all beginning today.  EXAM: CHEST  2 VIEW  COMPARISON:  PA and lateral chest 08/02/2013.  FINDINGS: Linear atelectasis is seen the right lung base. The lungs are otherwise clear. Heart size is upper normal. No pneumothorax or pleural effusion.  IMPRESSION: Mild right basilar atelectasis.  No acute disease.   Electronically Signed   By: Drusilla Kanner M.D.   On: 07/10/2014 14:55     EKG Interpretation   Date/Time:  Friday July 10 2014 12:07:39 EDT Ventricular Rate:  62 PR Interval:  163 QRS Duration: 91 QT Interval:  390 QTC Calculation: 396 R Axis:   61 Text Interpretation:  Sinus rhythm Atrial premature complex No significant  change since last tracing Confirmed by Mirian Mo [phone]) on 07/10/2014  12:12:11 PM      MDM   Final diagnoses:  Myalgia    29 y.o. female with pertinent PMH of pericarditis presents with generalized myalgias, diarrhea, in setting of sick contact.  On arrival today the patient is well-appearing, is able to take by mouth intake without difficulty. She also has a history of anemia. Vital signs and physical exam as above.  Workup as above unremarkable. Patient was given normal saline bolus with vast improvement of symptoms. Discussed likely viral etiology, no signs of pericarditis or myocarditis on exam. DC home with standard return precautions..    I have reviewed all laboratory and imaging studies if ordered as above  1. Myalgia         Mirian Mo, MD 07/11/14 [phone]

## 2014-07-10 NOTE — Discharge Instructions (Signed)

## 2014-07-31 ENCOUNTER — Other Ambulatory Visit: Payer: BLUE CROSS/BLUE SHIELD

## 2014-08-04 ENCOUNTER — Ambulatory Visit: Payer: BLUE CROSS/BLUE SHIELD | Admitting: Cardiology

## 2014-12-29 ENCOUNTER — Telehealth: Payer: Self-pay | Admitting: Cardiology

## 2014-12-29 NOTE — Telephone Encounter (Signed)
Talked to patient.   Patient stated that she has had sharp pain under left shoulder blade  for about the last 4 days.  Has become more constant in the last 2 days.  Says it hurts worse to breath deeply. Said that she feels like she is starting to have an anxiety attack.  She has anxiety as a dx and has lorazepam.  She has a hx of pericarditis.  Also small child was yelling in the phone and she was having to try and quite them.  She has an appt with Dr Delton SeeNelson tomorrow.  I asked her if she had taken lorazepam today and replied that she hadn't taken one in awhile, but has some.  I suggested that she may want to take one and see if this helps her.  I advised her to keep her appt with Dr Delton SeeNelson in the morning.  Advised if pain gets worse or she becomes short of breath to seek immediate medical  Attention.

## 2014-12-29 NOTE — Telephone Encounter (Signed)
New message  Pt c/o of Chest Pain: STAT if CP now or developed within 24 hours  1. Are you having CP right now? Yes   2. Are you experiencing any other symptoms (ex. SOB, nausea, vomiting, sweating)? Sob. Just chest pain   3. How long have you been experiencing CP? Over the weekend 3-4 days   4. Is your CP continuous or coming and going? Continuous   5. Have you taken Nitroglycerin? No but she has taken Ibuprofen  ? Comments: Pain feels as if it is under her left shoulder blade which is making it hard for her to breathe. Sharpe radiating pain.   Appt scheduled 12/ 28 at 10:30a with Dr. Delton SeeNelson

## 2014-12-30 ENCOUNTER — Encounter: Payer: Self-pay | Admitting: Cardiology

## 2014-12-30 ENCOUNTER — Ambulatory Visit (INDEPENDENT_AMBULATORY_CARE_PROVIDER_SITE_OTHER): Payer: BLUE CROSS/BLUE SHIELD | Admitting: Cardiology

## 2014-12-30 VITALS — BP 100/74 | HR 54 | Ht 70.0 in | Wt 239.8 lb

## 2014-12-30 DIAGNOSIS — E0590 Thyrotoxicosis, unspecified without thyrotoxic crisis or storm: Secondary | ICD-10-CM

## 2014-12-30 DIAGNOSIS — D509 Iron deficiency anemia, unspecified: Secondary | ICD-10-CM | POA: Diagnosis not present

## 2014-12-30 DIAGNOSIS — R079 Chest pain, unspecified: Secondary | ICD-10-CM | POA: Diagnosis not present

## 2014-12-30 MED ORDER — COLCHICINE 0.6 MG PO TABS: 0.6000 mg | ORAL_TABLET | Freq: Two times a day (BID) | ORAL | Status: DC

## 2014-12-30 NOTE — Progress Notes (Signed)
Patient ID: Alison SayerValinda C Gomez, female   DOB: 07-31-1985, 29 y.o.   MRN: [id number]    Patient Name: Alison Gomez Date of Encounter: 12/30/2014  Primary Care Provider:  Redmond BasemanWONG,FRANCIS PATRICK, MD Primary Cardiologist: Alison Gomez  Patient Profile  Chest pain  Problem List   Past Medical History  Diagnosis Date  . Herpes genitalia   . Panic attack   . Trichomonas   . Previous cesarean delivery affecting pregnancy 01/10/2011  . Chest pain    Past Surgical History  Procedure Laterality Date  . Induced abortion    . Cesarean section  01/10/2011    Procedure: CESAREAN SECTION;  Surgeon: Sherron MondayJody Bovard, MD;  Location: [facility];  Service: Gynecology;  Laterality: N/A;  repeat  c/s  . Tooth extraction      Allergies  Allergies  Allergen Reactions  . Chocolate Anaphylaxis   Chief complain: chest pain   HPI  This is a very pleasant 29 year old female who was recently seen in the ER for profound vaginal bleeding that was followed after removal of her IUD because of vaginal perforation. As a part of bleeding workup abdominal CT was performed with limited view of heart. A small amount of per cardial effusion was described. The patient states that for the last couple of months she has experienced chest pain, it is non-exertional, it can happen anytime of the day, and is constant, radiating to her back. The pain feels sharp retrosternal, and changes with the position. The patient also states that she has history of anxiety, and then she had episodes of chest pain she also starts hyperventilating and eventually feel short of breath. No palpitations no dizziness, and no syncopal episode. The patient doesn't remember any viral-like illness prior to starting these chest pain episodes. No orthopnea no paroxysmal nocturnal dyspnea.  12/30/2014 - the patient presents with recurrence of chest pain symptoms, originating form bellow her shoulder plate, radiating to the front. Worse with inspiration  and position changes. Feels like before, started few months after she discontinued colchicine. Ibuprofen helps.   Home Medications  Prior to Admission medications   Medication Sig Start Date End Date Taking? Authorizing Provider  HYDROcodone-acetaminophen (NORCO/VICODIN) 5-325 MG per tablet Take 1 tablet by mouth every 6 (six) hours as needed for pain. 10/17/12   Shon Batonourtney F Horton, MD  ibuprofen (ADVIL,MOTRIN) 600 MG tablet Take 1 tablet (600 mg total) by mouth every 6 (six) hours as needed for pain. 10/17/12   Shon Batonourtney F Horton, MD  levonorgestrel-ethinyl estradiol (AVIANE,ALESSE,LESSINA) 0.1-20 MG-MCG tablet Take 1 tablet by mouth daily.    Historical Provider, MD    Family History  Family History  Problem Relation Age of Onset  . Anesthesia problems Neg Hx     Social History  Social History   Social History  . Marital Status: Single    Spouse Name: N/A  . Number of Children: N/A  . Years of Education: N/A   Occupational History  .  At [employer]   Social History Main Topics  . Smoking status: Never Smoker   . Smokeless tobacco: Never Used  . Alcohol Use: Yes     Comment: not during preg  . Drug Use: No  . Sexual Activity: Yes    Birth Control/ Protection: Injection   Other Topics Concern  . Not on file   Social History Narrative    Review of Systems, as per history of present illness otherwise negative General:  No chills, fever, night sweats or  weight changes.  Cardiovascular:  No chest pain, dyspnea on exertion, edema, orthopnea, palpitations, paroxysmal nocturnal dyspnea. Dermatological: No rash, lesions/masses Respiratory: No cough, dyspnea Urologic: No hematuria, dysuria Abdominal:   No nausea, vomiting, diarrhea, bright red blood per rectum, melena, or hematemesis Neurologic:  No visual changes, wkns, changes in mental status. All other systems reviewed and are otherwise negative except as noted above.  Physical Exam BP 112/76 mmHg, HR 70 BPM, W 229  lbs Blood pressure 100/74, pulse 54, height  (1.778 m), weight 239 lb 12.8 oz (108.773 kg).  General: Pleasant, NAD Psych: Normal affect. Neuro: Alert and oriented X 3. Moves all extremities spontaneously. HEENT: Normal  Neck: Supple without bruits or JVD. Lungs:  Resp regular and unlabored, CTA. Heart: RRR no s3, s4, or murmurs. mild rub Abdomen: Soft, non-tender, non-distended, BS + x 4.  Extremities: No clubbing, cyanosis or edema. DP/PT/Radials 2+ and equal bilaterally.  Accessory Clinical Findings  ECG - sinus rhythm with sinus arrhythmia, 60 beats per minute, no ST-T wave abnormalities, diffuse ST elevations predominantly in the inferolateral leads.  Echocardiogram - 01/16/2013  - Left ventricle: The cavity size was normal. Wall thickness was normal. Systolic function was normal. The estimated ejection fraction was in the range of 55% to 60%. Wall motion was normal; there were no regional wall motion abnormalities. Left ventricular diastolic function parameters were normal. - Pericardium, extracardiac: A small pericardial effusion was identified. Impressions:  - No prior study for comparison; normal LV function; small pericardial effusion.    Assessment & Plan  29 year old female  1. Recurrent acute pericarditis, we will restart colchicine 0.6 PO BID x 2 months, advised not to use ibuprofen long term to avoid kidney impairment.  If no improvement advised to try a chiropractor as this might be related to her back problems.  2. Hyperthyroidism - rechecked TSH and free T3 and free T4 all within normal limits. She follows with endocrinology.  3. Microcytic anemia - started on iron sulfate 325 mg orally twice a day a month ago, repeated Hb 8.9 -->9.6, now off Fe pills.   Follow up in 6 months unless symptoms worsen.  Alison Masson, MD 12/30/2014, 11:07 AM

## 2014-12-30 NOTE — Telephone Encounter (Signed)
Pt has an appt with Dr Delton SeeNelson today at 1030.  Scheduler left a message yesterday for the pt to call back to confirm appt.

## 2014-12-30 NOTE — Patient Instructions (Signed)
Medication Instructions:   START BACK TAKING COLCHICINE 0.6 MG TWICE DAILY FOR 2 MONTHS ONLY     Follow-Up:  Your physician wants you to follow-up in: 6 MONTHS WITH DR Johnell ComingsNELSON You will receive a reminder letter in the mail two months in advance. If you don't receive a letter, please call our office to schedule the follow-up appointment.      If you need a refill on your cardiac medications before your next appointment, please call your pharmacy.

## 2015-04-05 DIAGNOSIS — F3175 Bipolar disorder, in partial remission, most recent episode depressed: Secondary | ICD-10-CM | POA: Diagnosis not present

## 2015-04-13 DIAGNOSIS — J069 Acute upper respiratory infection, unspecified: Secondary | ICD-10-CM | POA: Diagnosis not present

## 2015-04-13 DIAGNOSIS — R0789 Other chest pain: Secondary | ICD-10-CM | POA: Diagnosis not present

## 2015-04-19 ENCOUNTER — Emergency Department (HOSPITAL_COMMUNITY)
Admission: EM | Admit: 2015-04-19 | Discharge: 2015-04-19 | Disposition: A | Discharge status: Home / Self Care | Payer: BLUE CROSS/BLUE SHIELD | Attending: Emergency Medicine | Admitting: Emergency Medicine

## 2015-04-19 ENCOUNTER — Emergency Department (HOSPITAL_COMMUNITY): Payer: BLUE CROSS/BLUE SHIELD

## 2015-04-19 ENCOUNTER — Encounter (HOSPITAL_COMMUNITY): Payer: Self-pay | Admitting: Emergency Medicine

## 2015-04-19 DIAGNOSIS — F410 Panic disorder [episodic paroxysmal anxiety] without agoraphobia: Secondary | ICD-10-CM | POA: Insufficient documentation

## 2015-04-19 DIAGNOSIS — Z8619 Personal history of other infectious and parasitic diseases: Secondary | ICD-10-CM | POA: Insufficient documentation

## 2015-04-19 DIAGNOSIS — Z79899 Other long term (current) drug therapy: Secondary | ICD-10-CM | POA: Insufficient documentation

## 2015-04-19 DIAGNOSIS — M546 Pain in thoracic spine: Secondary | ICD-10-CM | POA: Insufficient documentation

## 2015-04-19 DIAGNOSIS — R0789 Other chest pain: Secondary | ICD-10-CM | POA: Diagnosis not present

## 2015-04-19 DIAGNOSIS — R0781 Pleurodynia: Secondary | ICD-10-CM | POA: Diagnosis not present

## 2015-04-19 DIAGNOSIS — R350 Frequency of micturition: Secondary | ICD-10-CM | POA: Diagnosis not present

## 2015-04-19 DIAGNOSIS — R079 Chest pain, unspecified: Secondary | ICD-10-CM | POA: Diagnosis not present

## 2015-04-19 LAB — URINALYSIS, ROUTINE W REFLEX MICROSCOPIC
Bilirubin Urine: NEGATIVE
Glucose, UA: NEGATIVE mg/dL
Hgb urine dipstick: NEGATIVE
Ketones, ur: NEGATIVE mg/dL
Leukocytes, UA: NEGATIVE
Nitrite: NEGATIVE
Protein, ur: NEGATIVE mg/dL
Specific Gravity, Urine: 1.022 (ref 1.005–1.030)
pH: 6.5 (ref 5.0–8.0)

## 2015-04-19 LAB — I-STAT CHEM 8, ED
BUN: 16 mg/dL (ref 6–20)
Calcium, Ion: 1.18 mmol/L (ref 1.12–1.23)
Chloride: 104 mmol/L (ref 101–111)
Creatinine, Ser: 0.7 mg/dL (ref 0.44–1.00)
Glucose, Bld: 86 mg/dL (ref 65–99)
HCT: 39 % (ref 36.0–46.0)
Hemoglobin: 13.3 g/dL (ref 12.0–15.0)
Potassium: 4.5 mmol/L (ref 3.5–5.1)
Sodium: 140 mmol/L (ref 135–145)
TCO2: 25 mmol/L (ref 0–100)

## 2015-04-19 LAB — RAPID HIV SCREEN (HIV 1/2 AB+AG)
HIV 1/2 Antibodies: NONREACTIVE
HIV-1 P24 Antigen - HIV24: NONREACTIVE

## 2015-04-19 LAB — D-DIMER, QUANTITATIVE: D-Dimer, Quant: 0.27 ug/mL-FEU (ref 0.00–0.50)

## 2015-04-19 MED ORDER — OXYCODONE-ACETAMINOPHEN 5-325 MG PO TABS
1.0000 | ORAL_TABLET | Freq: Once | ORAL | Status: AC
  Administered 2015-04-19: 1 via ORAL
  Filled 2015-04-19: qty 1

## 2015-04-19 MED ORDER — HYDROCODONE-ACETAMINOPHEN 5-325 MG PO TABS: 1.0000 | ORAL_TABLET | ORAL | Status: DC | PRN

## 2015-04-19 NOTE — ED Notes (Signed)
Unable to collect at this time patient is not in the room 

## 2015-04-19 NOTE — ED Provider Notes (Signed)
CSN: [id number]     Arrival date & time 04/19/15  1314 History  By signing my name below, I, Alison Gomez, attest that this documentation has been prepared under the direction and in the presence of [employer], PA-C. Electronically Signed: Placido Gomez, ED Scribe. 04/19/2015. 1:58 PM.   Chief Complaint  Patient presents with  . Urinary Frequency   The history is provided by the patient. No language interpreter was used.    HPI Comments: Alison Gomez is a 30 y.o. female who presents to the Emergency Department by ambulance complaining of constant, mild, left rib pain x 3 weeks that worsened this morning after lifting her young child. She states that she has been experiencing back pain and was seen by her PCP and had x-rays performed of her chest and back with no acute abnormalities and was dx with a muscle spasm. She was d/c with tramadol which she states applies short term relief of her pain. Pt reports that she was picking up her son earlier today and felt a "pull" in her left ribs and experienced a sudden worsening of her rib pain. She reports associated, mild, urinary frequency, intermittent brown vaginal discharge when wiping, and mild SOB due to pain x 2 weeks. She denies dysuria stating that during urination it "feels unusual". Her pain alleviates to 5/10 while lying supine and worsens to 9/10 with any movement. She reports a PMHx of anxiety and denies this is consistent with past anxiety exacerbations further noting that she typically experiences lower back pain during her bouts with anxiety. She denies a hx of smoking, taking birth control or a PMHx of DVT/PE. She is sexually active with a female partner. Pt has an appointment with her psychiatrist later today as well as a physical therapy appointment later this week. She denies incontinence of her bowels or bladder, cough, fever, back pain or any other associated symptoms at this time.    Past Medical History  Diagnosis Date  .  Herpes genitalia   . Panic attack   . Trichomonas   . Previous cesarean delivery affecting pregnancy 01/10/2011  . Chest pain    Past Surgical History  Procedure Laterality Date  . Induced abortion    . Cesarean section  01/10/2011    Procedure: CESAREAN SECTION;  Surgeon: Sherron Monday, MD;  Location: [facility];  Service: Gynecology;  Laterality: N/A;  repeat  c/s  . Tooth extraction     Family History  Problem Relation Age of Onset  . Anesthesia problems Neg Hx    Social History  Substance Use Topics  . Smoking status: Never Smoker   . Smokeless tobacco: Never Used  . Alcohol Use: Yes     Comment: not during preg   OB History    Gravida Para Term Preterm AB TAB SAB Ectopic Multiple Living   Review of Systems A complete 10 system review of systems was obtained and all systems are negative except as noted in the HPI and PMH.   Allergies  Chocolate  Home Medications   Prior to Admission medications   Medication Sig Start Date End Date Taking? Authorizing Provider  acetaminophen (TYLENOL) 500 MG tablet Take 1,000 mg by mouth daily as needed for moderate pain or headache.   Yes Historical Provider, MD  ibuprofen (ADVIL,MOTRIN) 200 MG tablet Take 400 mg by mouth daily as needed for headache or moderate pain.   Yes  Historical Provider, MD  LORazepam (ATIVAN) 1 MG tablet Take 1 mg by mouth 2 (two) times daily as needed for anxiety.  05/08/13  Yes Historical Provider, MD  naproxen sodium (ANAPROX) 220 MG tablet Take 440 mg by mouth daily as needed (pain).   Yes Historical Provider, MD  QUEtiapine Fumarate (SEROQUEL XR) 150 MG 24 hr tablet Take 150 mg by mouth at bedtime. 04/15/15  Yes Historical Provider, MD  sertraline (ZOLOFT) 100 MG tablet Take 100 mg by mouth daily.   Yes Historical Provider, MD  tiZANidine (ZANAFLEX) 2 MG tablet Take 2 mg by mouth every 8 (eight) hours as needed for muscle spasms.  04/13/15  Yes Historical Provider, MD  colchicine 0.6 MG tablet Take  1 tablet (0.6 mg total) by mouth 2 (two) times daily. Take this for 2 months only 12/30/14   Lars Masson, MD  HYDROcodone-acetaminophen (NORCO/VICODIN) 5-325 MG tablet Take 1 tablet by mouth every 4 (four) hours as needed. 04/19/15   Eyvonne Mechanic, PA-C   BP 110/72 mmHg  Pulse 50  Resp 15  SpO2 100%  LMP 04/02/2015 (Approximate)    Physical Exam  Constitutional: She is oriented to person, place, and time. She appears well-developed and well-nourished.  HENT:  Head: Normocephalic and atraumatic.  Eyes: EOM are normal.  Neck: Normal range of motion.  Cardiovascular: Normal rate, regular rhythm, normal heart sounds and intact distal pulses.  Exam reveals no gallop and no friction rub.   No murmur heard. Pulmonary/Chest: Effort normal and breath sounds normal. No respiratory distress. She has no wheezes. She has no rales. She exhibits no tenderness.  TTP of left lateral chest wall and left back, no rash, bruising. Lung expansion normal.   Abdominal: Soft. Bowel sounds are normal. She exhibits no distension and no mass. There is no tenderness. There is no rebound and no guarding.  Musculoskeletal: Normal range of motion.  No CT or L-spine tenderness, no signs of trauma to the back. Lower extremity strength 5 out of 5, sensation intact, full active range of motion  Neurological: She is alert and oriented to person, place, and time.  Skin: Skin is warm and dry.  Psychiatric: She has a normal mood and affect.  Nursing note and vitals reviewed.   ED Course  Procedures  DIAGNOSTIC STUDIES: Oxygen Saturation is 100% on RA, normal by my interpretation.    COORDINATION OF CARE: 1:56 PM Discussed next steps with pt. She verbalized understanding and is agreeable with the plan.   Labs Review Labs Reviewed  URINALYSIS, ROUTINE W REFLEX MICROSCOPIC (NOT AT [facility]) - Abnormal; Notable for the following:    APPearance CLOUDY (*)    All other components within normal limits  D-DIMER,  QUANTITATIVE (NOT AT [facility])  RAPID HIV SCREEN (HIV 1/2 AB+AG)  HIV ANTIBODY (ROUTINE TESTING)  I-STAT CHEM 8, ED    Imaging Review Dg Ribs Unilateral W/chest Left  04/19/2015  CLINICAL DATA:  Left chest pain for 3 weeks which worsened today after lifting a child. Initial encounter. EXAM: LEFT RIBS AND CHEST - 3+ VIEW COMPARISON:  PA and lateral chest 07/10/2014. FINDINGS: The lungs are clear. Heart size is normal. No pneumothorax or pleural effusion. No bony abnormality. IMPRESSION: Negative exam. Electronically Signed   By: Drusilla Kanner M.D.   On: 04/19/2015 15:47   I have personally reviewed and evaluated these images and lab results as part of my medical decision-making.   EKG Interpretation None      MDM  Final diagnoses:  Rib pain  Urinary frequency    Labs: HIV antibody, rapid HIV screen, I-state chem 8, D-dimer and urinalysis routine with reflex microscopic  Imaging: DG Ribs unilateral w/chest left; EKG  Consults: none  Therapeutics: 1x percocet  Assessment: 30 year old female presents today via EMS. Patient seemed to be very anxious. She recently been seen for the same, she had a negative workup at that time. At the time my evaluation she was complaining of left rib pain, she had a rib film with chest which showed no signs of infection or rib fracture. She was extremely tender to palpation with no signs of trauma to the chest wall. I believe patient's symptoms are most consistent with costochondritis, with the addition of anxiety. Due to patient's persistent symptoms and reported shortness of breath despite 100% oxygenation and no tachypnea I went down the PE pathway with a d-dimer. D-dimer was negative, patient shortness of breath had dissipated and her pain improved with pain medication here in the ED. I continued my physical exam and was able to evaluate her back with no significant findings. I spoke extensively with her and her mother about indications for returning to  the ED, she will be given pain medication and encouraged follow-up with her primary care. Patient also had complaints of urinary frequency and vaginal discharge. Initially I intended to do a pelvic exam as patient had discharge, she did not have any vaginal or pelvic pain or bleeding. Because of patient's pain with movement in the exam bed pelvic exam will be deferred to her OB/GYN if symptoms continue to persist. Urinalysis negative here. Patient verbalized understanding and agreement to today's plan had no further questions or concerns at time of discharge.  Plan: Pt given strict return precautions, verbalized understanding and agreement to today's plan and had no further questions or concerns at the time of discharge.   I personally performed the services described in this documentation, which was scribed in my presence. The recorded information has been reviewed and is accurate.   Eyvonne MechanicJeffrey Sheza Strickland, PA-C 04/19/15 1703  Lorre NickAnthony Allen, MD 04/23/15 [phone]

## 2015-04-19 NOTE — ED Notes (Signed)
PT was able to stand and walk to and from restroom slow and steady steps

## 2015-04-19 NOTE — ED Notes (Signed)
Bed: WA17 Expected date:  Expected time:  Means of arrival:  Comments: Fast track  

## 2015-04-19 NOTE — Discharge Instructions (Signed)
Costochondritis  Costochondritis is a condition in which the tissue (cartilage) that connects your ribs with your breastbone (sternum) becomes irritated. It causes pain in the chest and rib area. It usually goes away on its own over time.  HOME CARE  · Avoid activities that wear you out.  · Do not strain your ribs. Avoid activities that use your:    Chest.    Belly.    Side muscles.  · Put ice on the area for the first 2 days after the pain starts.    Put ice in a plastic bag.    Place a towel between your skin and the bag.    Leave the ice on for 20 minutes, 2-3 times a day.  · Only take medicine as told by your doctor.  GET HELP IF:  · You have redness or puffiness (swelling) in the rib area.  · Your pain does not go away with rest or medicine.  GET HELP RIGHT AWAY IF:   · Your pain gets worse.  · You are very uncomfortable.  · You have trouble breathing.  · You cough up blood.  · You start sweating or throwing up (vomiting).  · You have a fever or lasting symptoms for more than 2-3 days.  · You have a fever and your symptoms suddenly get worse.  MAKE SURE YOU:   · Understand these instructions.  · Will watch your condition.  · Will get help right away if you are not doing well or get worse.     This information is not intended to replace advice given to you by your health care provider. Make sure you discuss any questions you have with your health care provider.     Document Released: 06/07/2007 Document Revised: 08/21/2012 Document Reviewed: 07/23/2012  Elsevier Interactive Patient Education ©2016 [employer]

## 2015-04-19 NOTE — ED Notes (Signed)
Patient presents from home via EMS for left sided rib cage pain and anxiety x3 weeks. EMS reports patient went to pick up child today and felt something pill in left rib cage. No obvious deformity, no recent trauma.   Last VS 127/68, 72hr, 100%ra

## 2015-04-20 DIAGNOSIS — F3175 Bipolar disorder, in partial remission, most recent episode depressed: Secondary | ICD-10-CM | POA: Diagnosis not present

## 2015-04-20 LAB — HIV ANTIBODY (ROUTINE TESTING W REFLEX): HIV Screen 4th Generation wRfx: NONREACTIVE

## 2015-04-21 DIAGNOSIS — M546 Pain in thoracic spine: Secondary | ICD-10-CM | POA: Diagnosis not present

## 2015-04-21 DIAGNOSIS — R0789 Other chest pain: Secondary | ICD-10-CM | POA: Diagnosis not present

## 2015-04-21 DIAGNOSIS — F3175 Bipolar disorder, in partial remission, most recent episode depressed: Secondary | ICD-10-CM | POA: Diagnosis not present

## 2015-04-22 DIAGNOSIS — R0789 Other chest pain: Secondary | ICD-10-CM | POA: Diagnosis not present

## 2015-04-22 DIAGNOSIS — M546 Pain in thoracic spine: Secondary | ICD-10-CM | POA: Diagnosis not present

## 2015-04-27 DIAGNOSIS — R0789 Other chest pain: Secondary | ICD-10-CM | POA: Diagnosis not present

## 2015-04-27 DIAGNOSIS — M546 Pain in thoracic spine: Secondary | ICD-10-CM | POA: Diagnosis not present

## 2015-05-04 DIAGNOSIS — M546 Pain in thoracic spine: Secondary | ICD-10-CM | POA: Diagnosis not present

## 2015-05-04 DIAGNOSIS — R0789 Other chest pain: Secondary | ICD-10-CM | POA: Diagnosis not present

## 2015-05-07 DIAGNOSIS — R0789 Other chest pain: Secondary | ICD-10-CM | POA: Diagnosis not present

## 2015-05-07 DIAGNOSIS — M546 Pain in thoracic spine: Secondary | ICD-10-CM | POA: Diagnosis not present

## 2015-05-11 DIAGNOSIS — R0789 Other chest pain: Secondary | ICD-10-CM | POA: Diagnosis not present

## 2015-05-11 DIAGNOSIS — M546 Pain in thoracic spine: Secondary | ICD-10-CM | POA: Diagnosis not present

## 2015-05-11 DIAGNOSIS — F3175 Bipolar disorder, in partial remission, most recent episode depressed: Secondary | ICD-10-CM | POA: Diagnosis not present

## 2015-07-02 DIAGNOSIS — D649 Anemia, unspecified: Secondary | ICD-10-CM | POA: Diagnosis not present

## 2015-07-02 DIAGNOSIS — F419 Anxiety disorder, unspecified: Secondary | ICD-10-CM | POA: Diagnosis not present

## 2015-07-02 DIAGNOSIS — E0590 Thyrotoxicosis, unspecified without thyrotoxic crisis or storm: Secondary | ICD-10-CM | POA: Diagnosis not present

## 2015-07-02 DIAGNOSIS — I319 Disease of pericardium, unspecified: Secondary | ICD-10-CM | POA: Diagnosis not present

## 2015-07-16 DIAGNOSIS — J029 Acute pharyngitis, unspecified: Secondary | ICD-10-CM | POA: Diagnosis not present

## 2015-07-16 DIAGNOSIS — J209 Acute bronchitis, unspecified: Secondary | ICD-10-CM | POA: Diagnosis not present

## 2015-07-22 ENCOUNTER — Encounter: Payer: Self-pay | Admitting: Cardiology

## 2015-08-03 ENCOUNTER — Ambulatory Visit: Payer: BLUE CROSS/BLUE SHIELD | Admitting: Cardiology

## 2015-08-03 DIAGNOSIS — R0989 Other specified symptoms and signs involving the circulatory and respiratory systems: Secondary | ICD-10-CM

## 2015-08-22 IMAGING — CT CT ABD-PELV W/ CM
1 of 2 series · 15 of 32 positions shown, 19 images · IV contrast (OMNIPAQUE 300)
Comparison: Prior CT abdomen pelvis obtained earlier today without
intravenous contrast material at [DATE] a.m.

CLINICAL DATA: Right lower quadrant pain - repeat CT scan to
evaluate appendix

CT ABDOMEN AND PELVIS WITH CONTRAST
TECHNIQUE: Multidetector CT imaging of the abdomen and pelvis was
performed following the standard protocol during bolus
administration of intravenous contrast.
Contrast: 50mL OMNIPAQUE IOHEXOL 300 MG/ML SOLN, 100mL OMNIPAQUE
IOHEXOL 300 MG/ML SOLN

[Series 2: abd/pel with · axial · 0.69mm/px · z∈[+1306,+1696]mm · 15 of 86 slices shown, 19 images]
[im 4/86  soft-tissue]
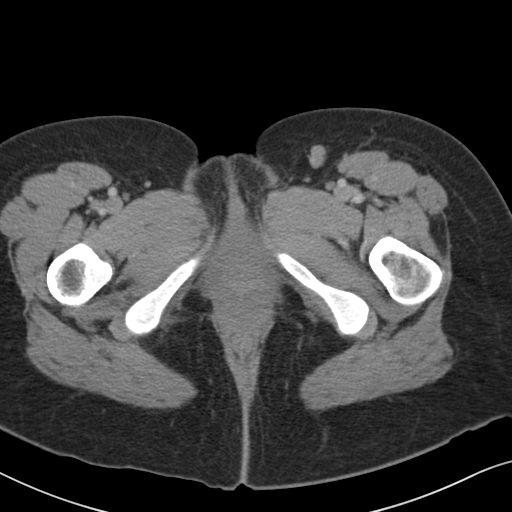
[im 4/86  bone]
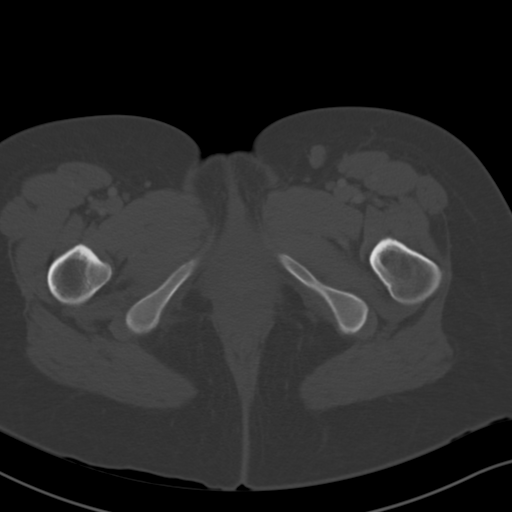
[im 11/86  soft-tissue]
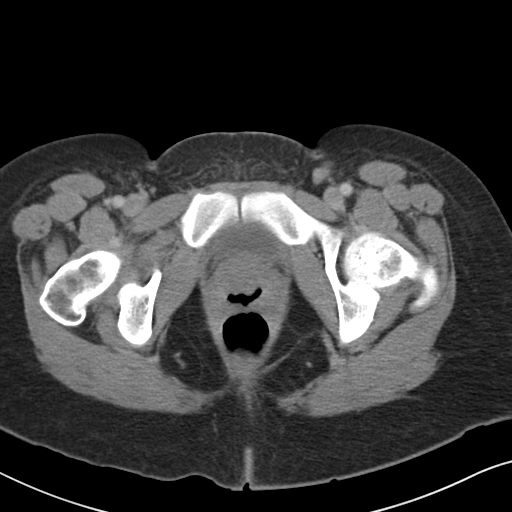
[im 18/86  soft-tissue]
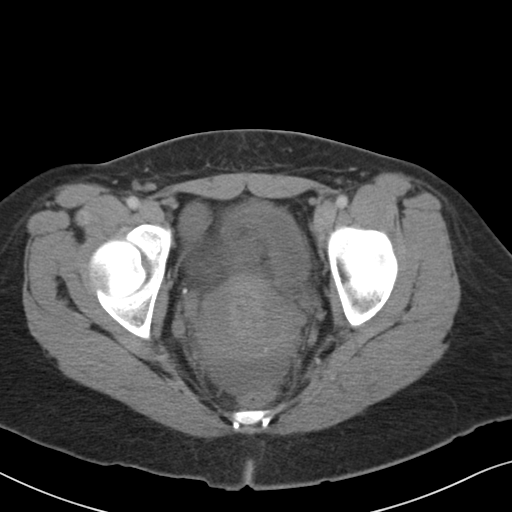
[im 24/86  soft-tissue]
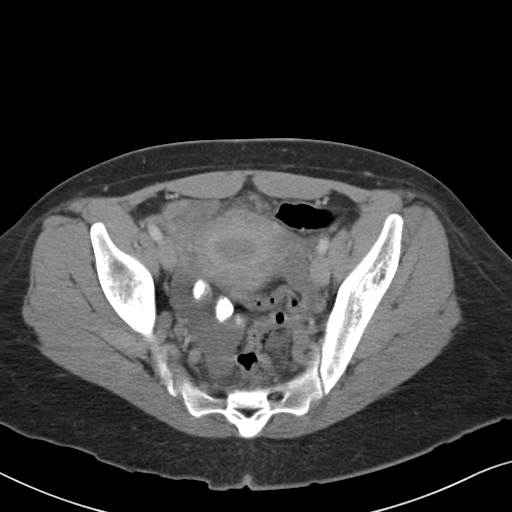
[im 31/86  soft-tissue]
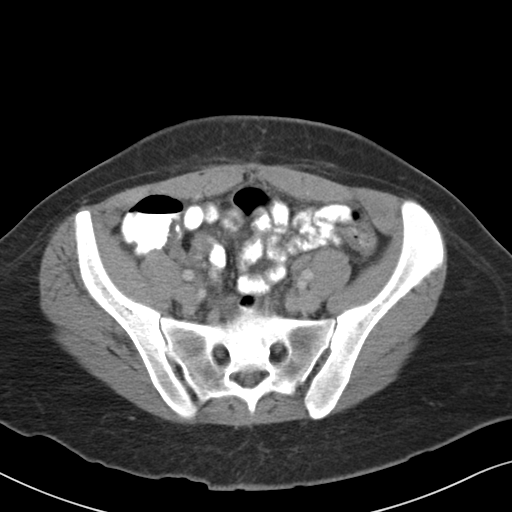
[im 38/86  soft-tissue]
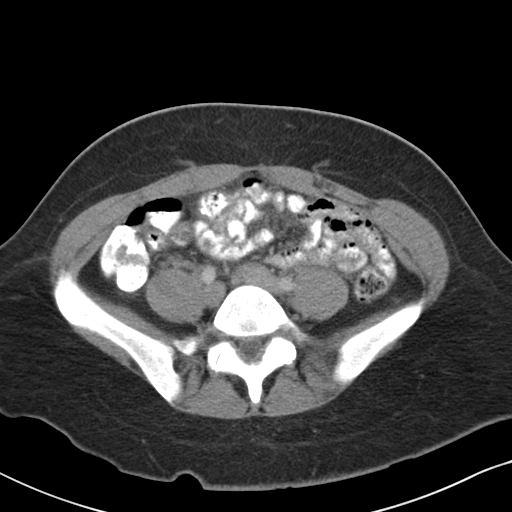
[im 45/86  soft-tissue]
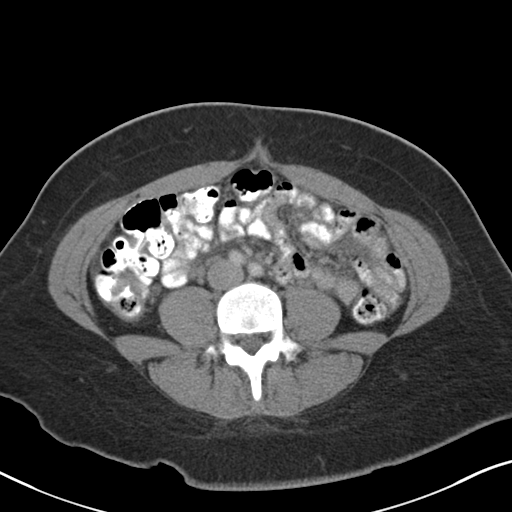
[im 48/86  soft-tissue]
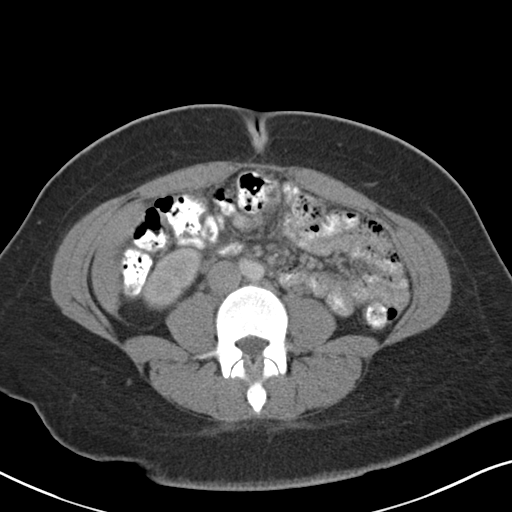
[im 55/86  soft-tissue]
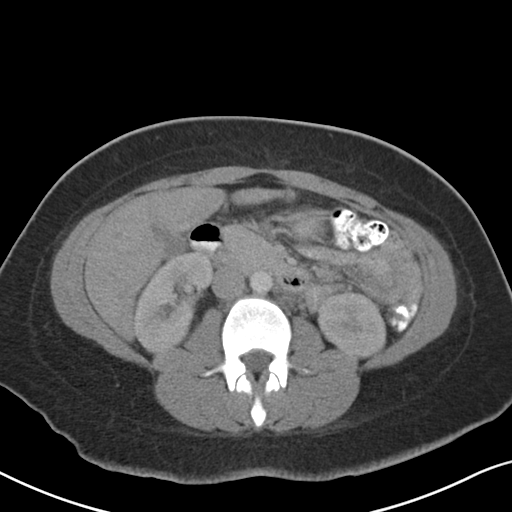
[im 55/86  bone]
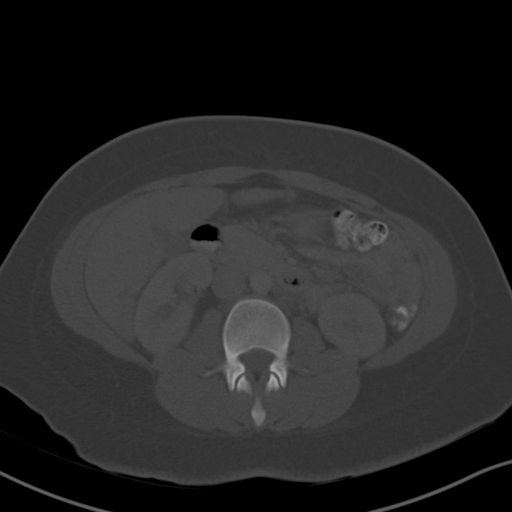
[im 62/86  soft-tissue]
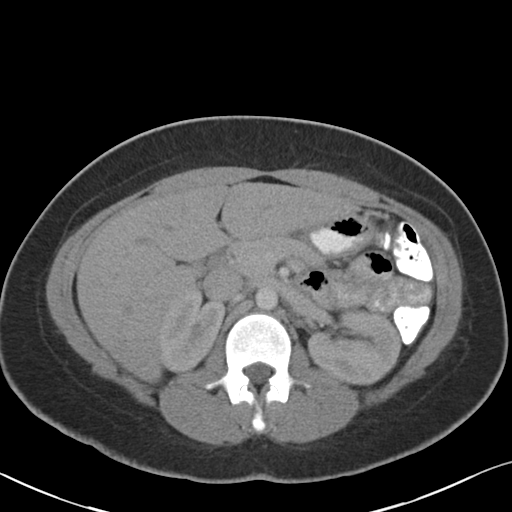
[im 69/86  soft-tissue]
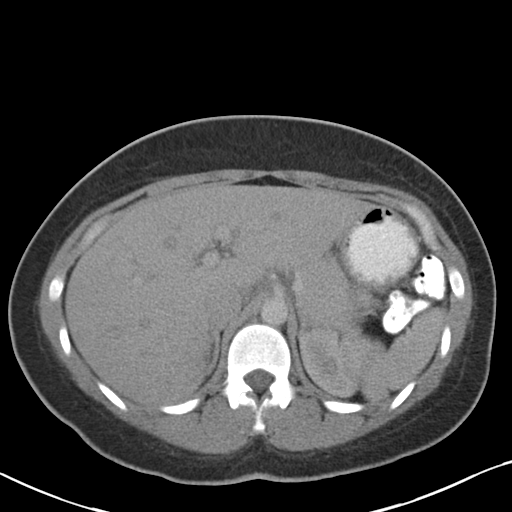
[im 72/86  lung]
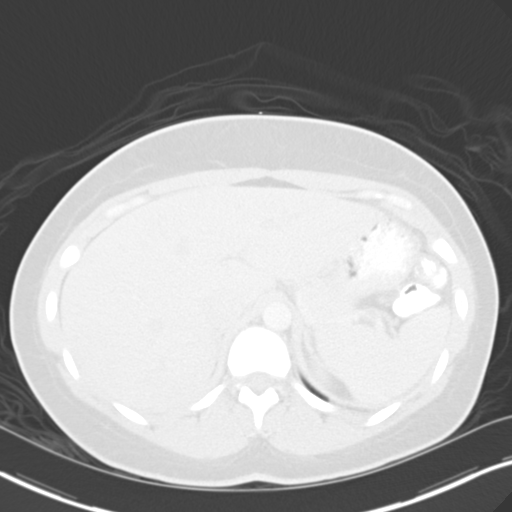
[im 75/86  soft-tissue]
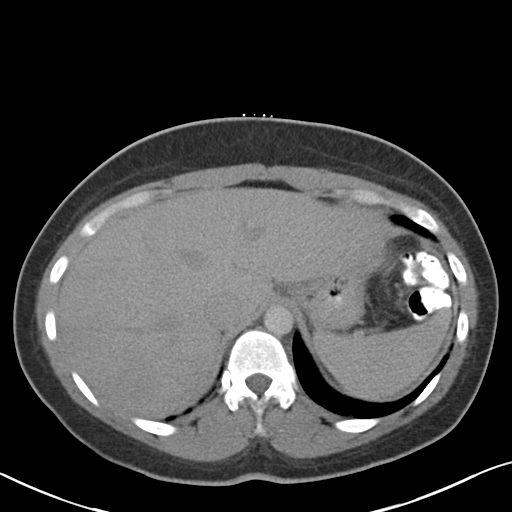
[im 75/86  lung]
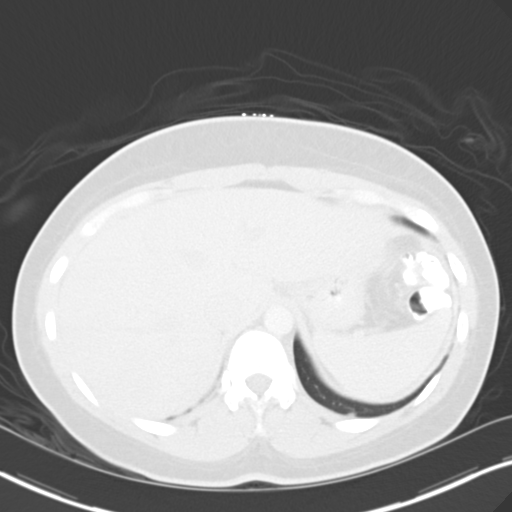
[im 79/86  lung]
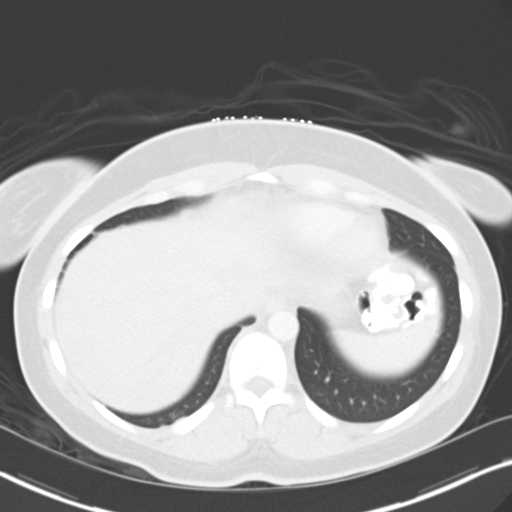
[im 82/86  soft-tissue]
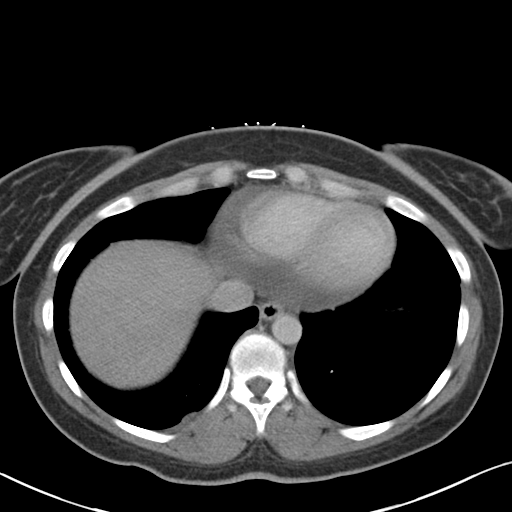
[im 82/86  lung]
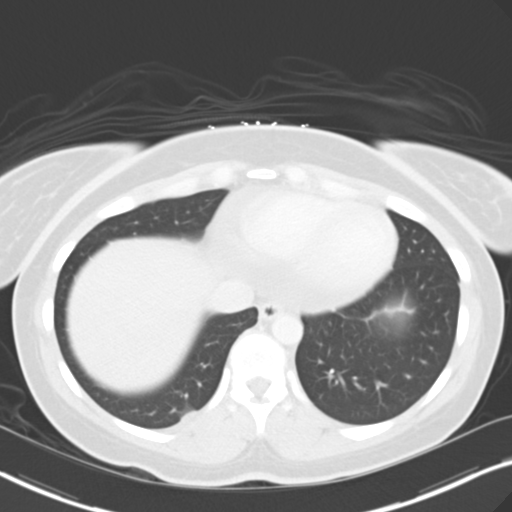

[15 of 32 positions shown; findings below may reference images not displayed]

FINDINGS: Repeat imaging of the abdomen and pelvis now demonstrates a thin
linear extension of contrast material in the right lower quadrant
consistent with the lumen of the normal appendix.

In the interim since the prior study, the intrauterine device has
been removed.  There is persistent mild stranding in the pre
vesicular space of Retzius anterior to the uterus.  Unchanged low
attenuation ascites in the anatomic pelvis without significant
interval change.  The abnormal tubular structure identified on the
prior examination is again seen.  On the present images, the more
anterior soft tissue density component likely represents the right
ovary.  The more fluid density component posteriorly either
represents an associated ovarian cyst or just a focal collection of
the pelvic ascites immediately adjacent to the ovary. The addition
of intravenous contrast on the present study reveals no other new
findings.  Otherwise, no significant change or interval finding
compared to the CT scan performed earlier today.
IMPRESSION: 1.  The appendix is now identified and is normal.
2.  Interval removal of the intrauterine device since the recent
prior CT scan.
3.  The addition of intravenous contrast helps identify the right
ovary in the region of the complex fluid collection along the right
margin of the uterus described on the prior CT scan.  On the
present study, this appears to be the ovary anteriorly and low
attenuation pelvic ascites posteriorly.
4.  Otherwise, no new findings or significant interval change
compared to the CT scan from earlier today.

## 2015-08-24 DIAGNOSIS — Z3202 Encounter for pregnancy test, result negative: Secondary | ICD-10-CM | POA: Diagnosis not present

## 2015-08-24 DIAGNOSIS — Z1389 Encounter for screening for other disorder: Secondary | ICD-10-CM | POA: Diagnosis not present

## 2015-08-24 DIAGNOSIS — Z6831 Body mass index (BMI) 31.0-31.9, adult: Secondary | ICD-10-CM | POA: Diagnosis not present

## 2015-08-24 DIAGNOSIS — Z130 Encounter for screening for diseases of the blood and blood-forming organs and certain disorders involving the immune mechanism: Secondary | ICD-10-CM | POA: Diagnosis not present

## 2015-08-24 DIAGNOSIS — Z01419 Encounter for gynecological examination (general) (routine) without abnormal findings: Secondary | ICD-10-CM | POA: Diagnosis not present

## 2015-08-24 DIAGNOSIS — Z3009 Encounter for other general counseling and advice on contraception: Secondary | ICD-10-CM | POA: Diagnosis not present

## 2015-08-24 DIAGNOSIS — N920 Excessive and frequent menstruation with regular cycle: Secondary | ICD-10-CM | POA: Diagnosis not present

## 2015-09-01 DIAGNOSIS — Z3202 Encounter for pregnancy test, result negative: Secondary | ICD-10-CM | POA: Diagnosis not present

## 2015-09-01 DIAGNOSIS — Z30013 Encounter for initial prescription of injectable contraceptive: Secondary | ICD-10-CM | POA: Diagnosis not present

## 2015-12-31 DIAGNOSIS — R0789 Other chest pain: Secondary | ICD-10-CM | POA: Diagnosis not present

## 2016-01-07 DIAGNOSIS — M62830 Muscle spasm of back: Secondary | ICD-10-CM | POA: Diagnosis not present

## 2016-01-07 DIAGNOSIS — M546 Pain in thoracic spine: Secondary | ICD-10-CM | POA: Diagnosis not present

## 2016-01-11 DIAGNOSIS — M546 Pain in thoracic spine: Secondary | ICD-10-CM | POA: Diagnosis not present

## 2016-01-11 DIAGNOSIS — M62830 Muscle spasm of back: Secondary | ICD-10-CM | POA: Diagnosis not present

## 2016-01-13 DIAGNOSIS — M546 Pain in thoracic spine: Secondary | ICD-10-CM | POA: Diagnosis not present

## 2016-01-13 DIAGNOSIS — M62830 Muscle spasm of back: Secondary | ICD-10-CM | POA: Diagnosis not present

## 2016-01-18 DIAGNOSIS — N920 Excessive and frequent menstruation with regular cycle: Secondary | ICD-10-CM | POA: Diagnosis not present

## 2016-01-18 DIAGNOSIS — Z113 Encounter for screening for infections with a predominantly sexual mode of transmission: Secondary | ICD-10-CM | POA: Diagnosis not present

## 2016-02-23 DIAGNOSIS — Z23 Encounter for immunization: Secondary | ICD-10-CM | POA: Diagnosis not present

## 2016-02-29 DIAGNOSIS — M546 Pain in thoracic spine: Secondary | ICD-10-CM | POA: Diagnosis not present

## 2016-02-29 DIAGNOSIS — Z713 Dietary counseling and surveillance: Secondary | ICD-10-CM | POA: Diagnosis not present

## 2016-02-29 DIAGNOSIS — Z136 Encounter for screening for cardiovascular disorders: Secondary | ICD-10-CM | POA: Diagnosis not present

## 2016-02-29 DIAGNOSIS — Z6833 Body mass index (BMI) 33.0-33.9, adult: Secondary | ICD-10-CM | POA: Diagnosis not present

## 2016-02-29 DIAGNOSIS — Z13220 Encounter for screening for lipoid disorders: Secondary | ICD-10-CM | POA: Diagnosis not present

## 2016-02-29 DIAGNOSIS — M62830 Muscle spasm of back: Secondary | ICD-10-CM | POA: Diagnosis not present

## 2016-03-10 DIAGNOSIS — M62830 Muscle spasm of back: Secondary | ICD-10-CM | POA: Diagnosis not present

## 2016-03-10 DIAGNOSIS — M546 Pain in thoracic spine: Secondary | ICD-10-CM | POA: Diagnosis not present

## 2016-03-17 DIAGNOSIS — M62830 Muscle spasm of back: Secondary | ICD-10-CM | POA: Diagnosis not present

## 2016-03-17 DIAGNOSIS — M546 Pain in thoracic spine: Secondary | ICD-10-CM | POA: Diagnosis not present

## 2016-04-13 DIAGNOSIS — F3162 Bipolar disorder, current episode mixed, moderate: Secondary | ICD-10-CM | POA: Diagnosis not present

## 2016-04-13 DIAGNOSIS — F3175 Bipolar disorder, in partial remission, most recent episode depressed: Secondary | ICD-10-CM | POA: Diagnosis not present

## 2016-06-01 DIAGNOSIS — F3175 Bipolar disorder, in partial remission, most recent episode depressed: Secondary | ICD-10-CM | POA: Diagnosis not present

## 2016-08-24 DIAGNOSIS — F3175 Bipolar disorder, in partial remission, most recent episode depressed: Secondary | ICD-10-CM | POA: Diagnosis not present

## 2016-10-05 DIAGNOSIS — R509 Fever, unspecified: Secondary | ICD-10-CM | POA: Diagnosis not present

## 2016-10-05 DIAGNOSIS — J039 Acute tonsillitis, unspecified: Secondary | ICD-10-CM | POA: Diagnosis not present

## 2016-10-05 DIAGNOSIS — R197 Diarrhea, unspecified: Secondary | ICD-10-CM | POA: Diagnosis not present

## 2016-12-08 DIAGNOSIS — Z3202 Encounter for pregnancy test, result negative: Secondary | ICD-10-CM | POA: Diagnosis not present

## 2016-12-08 DIAGNOSIS — Z202 Contact with and (suspected) exposure to infections with a predominantly sexual mode of transmission: Secondary | ICD-10-CM | POA: Diagnosis not present

## 2016-12-08 DIAGNOSIS — N898 Other specified noninflammatory disorders of vagina: Secondary | ICD-10-CM | POA: Diagnosis not present

## 2016-12-08 DIAGNOSIS — Z3009 Encounter for other general counseling and advice on contraception: Secondary | ICD-10-CM | POA: Diagnosis not present

## 2016-12-08 DIAGNOSIS — Z113 Encounter for screening for infections with a predominantly sexual mode of transmission: Secondary | ICD-10-CM | POA: Diagnosis not present

## 2017-01-29 DIAGNOSIS — E0590 Thyrotoxicosis, unspecified without thyrotoxic crisis or storm: Secondary | ICD-10-CM | POA: Diagnosis not present

## 2017-01-29 DIAGNOSIS — D509 Iron deficiency anemia, unspecified: Secondary | ICD-10-CM | POA: Diagnosis not present

## 2017-01-29 DIAGNOSIS — E6609 Other obesity due to excess calories: Secondary | ICD-10-CM | POA: Diagnosis not present

## 2017-01-29 DIAGNOSIS — M62830 Muscle spasm of back: Secondary | ICD-10-CM | POA: Diagnosis not present

## 2017-01-29 DIAGNOSIS — F419 Anxiety disorder, unspecified: Secondary | ICD-10-CM | POA: Diagnosis not present

## 2017-01-29 DIAGNOSIS — M546 Pain in thoracic spine: Secondary | ICD-10-CM | POA: Diagnosis not present

## 2017-02-08 DIAGNOSIS — M546 Pain in thoracic spine: Secondary | ICD-10-CM | POA: Diagnosis not present

## 2017-02-08 DIAGNOSIS — F3175 Bipolar disorder, in partial remission, most recent episode depressed: Secondary | ICD-10-CM | POA: Diagnosis not present

## 2017-02-09 DIAGNOSIS — Z3202 Encounter for pregnancy test, result negative: Secondary | ICD-10-CM | POA: Diagnosis not present

## 2017-02-09 DIAGNOSIS — Z01419 Encounter for gynecological examination (general) (routine) without abnormal findings: Secondary | ICD-10-CM | POA: Diagnosis not present

## 2017-02-09 DIAGNOSIS — R1032 Left lower quadrant pain: Secondary | ICD-10-CM | POA: Diagnosis not present

## 2017-02-09 DIAGNOSIS — Z124 Encounter for screening for malignant neoplasm of cervix: Secondary | ICD-10-CM | POA: Diagnosis not present

## 2017-02-09 DIAGNOSIS — Z1151 Encounter for screening for human papillomavirus (HPV): Secondary | ICD-10-CM | POA: Diagnosis not present

## 2017-02-09 DIAGNOSIS — Z3009 Encounter for other general counseling and advice on contraception: Secondary | ICD-10-CM | POA: Diagnosis not present

## 2017-02-09 DIAGNOSIS — Z6829 Body mass index (BMI) 29.0-29.9, adult: Secondary | ICD-10-CM | POA: Diagnosis not present

## 2017-02-09 DIAGNOSIS — Z1389 Encounter for screening for other disorder: Secondary | ICD-10-CM | POA: Diagnosis not present

## 2017-02-09 DIAGNOSIS — Z130 Encounter for screening for diseases of the blood and blood-forming organs and certain disorders involving the immune mechanism: Secondary | ICD-10-CM | POA: Diagnosis not present

## 2017-02-09 DIAGNOSIS — N760 Acute vaginitis: Secondary | ICD-10-CM | POA: Diagnosis not present

## 2017-02-09 DIAGNOSIS — N898 Other specified noninflammatory disorders of vagina: Secondary | ICD-10-CM | POA: Diagnosis not present

## 2017-02-15 DIAGNOSIS — M546 Pain in thoracic spine: Secondary | ICD-10-CM | POA: Diagnosis not present

## 2017-02-22 DIAGNOSIS — M546 Pain in thoracic spine: Secondary | ICD-10-CM | POA: Diagnosis not present

## 2017-03-02 DIAGNOSIS — M546 Pain in thoracic spine: Secondary | ICD-10-CM | POA: Diagnosis not present

## 2017-03-13 DIAGNOSIS — M62830 Muscle spasm of back: Secondary | ICD-10-CM | POA: Diagnosis not present

## 2017-03-13 DIAGNOSIS — F419 Anxiety disorder, unspecified: Secondary | ICD-10-CM | POA: Diagnosis not present

## 2017-03-13 DIAGNOSIS — E0590 Thyrotoxicosis, unspecified without thyrotoxic crisis or storm: Secondary | ICD-10-CM | POA: Diagnosis not present

## 2017-03-13 DIAGNOSIS — M546 Pain in thoracic spine: Secondary | ICD-10-CM | POA: Diagnosis not present

## 2017-03-22 DIAGNOSIS — F3175 Bipolar disorder, in partial remission, most recent episode depressed: Secondary | ICD-10-CM | POA: Diagnosis not present

## 2017-04-05 DIAGNOSIS — N3000 Acute cystitis without hematuria: Secondary | ICD-10-CM | POA: Diagnosis not present

## 2017-04-05 DIAGNOSIS — R350 Frequency of micturition: Secondary | ICD-10-CM | POA: Diagnosis not present

## 2017-04-05 DIAGNOSIS — N898 Other specified noninflammatory disorders of vagina: Secondary | ICD-10-CM | POA: Diagnosis not present

## 2017-04-05 DIAGNOSIS — N760 Acute vaginitis: Secondary | ICD-10-CM | POA: Diagnosis not present

## 2017-04-24 ENCOUNTER — Other Ambulatory Visit: Payer: Self-pay

## 2017-04-24 ENCOUNTER — Encounter (HOSPITAL_COMMUNITY): Payer: Self-pay | Admitting: Emergency Medicine

## 2017-04-24 DIAGNOSIS — O209 Hemorrhage in early pregnancy, unspecified: Secondary | ICD-10-CM | POA: Diagnosis not present

## 2017-04-24 DIAGNOSIS — O99331 Smoking (tobacco) complicating pregnancy, first trimester: Secondary | ICD-10-CM | POA: Diagnosis not present

## 2017-04-24 DIAGNOSIS — O208 Other hemorrhage in early pregnancy: Secondary | ICD-10-CM | POA: Diagnosis not present

## 2017-04-24 DIAGNOSIS — Z3A01 Less than 8 weeks gestation of pregnancy: Secondary | ICD-10-CM | POA: Diagnosis not present

## 2017-04-24 DIAGNOSIS — O200 Threatened abortion: Secondary | ICD-10-CM | POA: Insufficient documentation

## 2017-04-24 DIAGNOSIS — O26891 Other specified pregnancy related conditions, first trimester: Secondary | ICD-10-CM | POA: Diagnosis not present

## 2017-04-24 DIAGNOSIS — Z79899 Other long term (current) drug therapy: Secondary | ICD-10-CM | POA: Diagnosis not present

## 2017-04-24 DIAGNOSIS — R102 Pelvic and perineal pain: Secondary | ICD-10-CM | POA: Diagnosis not present

## 2017-04-24 DIAGNOSIS — F17200 Nicotine dependence, unspecified, uncomplicated: Secondary | ICD-10-CM | POA: Diagnosis not present

## 2017-04-24 NOTE — ED Triage Notes (Signed)
Pt states she took a home pregnancy test this morning that was positive  This evening she was doing squats and felt a gush and it was blood  Pt states it stopped and then about 5 minutes later it happened again

## 2017-04-25 ENCOUNTER — Emergency Department (HOSPITAL_COMMUNITY): Payer: BLUE CROSS/BLUE SHIELD

## 2017-04-25 ENCOUNTER — Emergency Department (HOSPITAL_COMMUNITY)
Admission: EM | Admit: 2017-04-25 | Discharge: 2017-04-25 | Disposition: A | Discharge status: Home / Self Care | Payer: BLUE CROSS/BLUE SHIELD | Attending: Emergency Medicine | Admitting: Emergency Medicine

## 2017-04-25 DIAGNOSIS — Z3A01 Less than 8 weeks gestation of pregnancy: Secondary | ICD-10-CM | POA: Diagnosis not present

## 2017-04-25 DIAGNOSIS — O200 Threatened abortion: Secondary | ICD-10-CM

## 2017-04-25 DIAGNOSIS — O208 Other hemorrhage in early pregnancy: Secondary | ICD-10-CM | POA: Diagnosis not present

## 2017-04-25 DIAGNOSIS — R102 Pelvic and perineal pain: Secondary | ICD-10-CM

## 2017-04-25 DIAGNOSIS — O209 Hemorrhage in early pregnancy, unspecified: Secondary | ICD-10-CM

## 2017-04-25 LAB — CBC WITH DIFFERENTIAL/PLATELET
Basophils Absolute: 0 10*3/uL (ref 0.0–0.1)
Basophils Relative: 0 %
Eosinophils Absolute: 0.1 10*3/uL (ref 0.0–0.7)
Eosinophils Relative: 1 %
HEMATOCRIT: 31.1 % — AB (ref 36.0–46.0)
HEMOGLOBIN: 9.7 g/dL — AB (ref 12.0–15.0)
LYMPHS ABS: 3.1 10*3/uL (ref 0.7–4.0)
Lymphocytes Relative: 43 %
MCH: 25 pg — AB (ref 26.0–34.0)
MCHC: 31.2 g/dL (ref 30.0–36.0)
MCV: 80.2 fL (ref 78.0–100.0)
MONO ABS: 0.6 10*3/uL (ref 0.1–1.0)
MONOS PCT: 8 %
NEUTROS ABS: 3.5 10*3/uL (ref 1.7–7.7)
NEUTROS PCT: 48 %
Platelets: 377 10*3/uL (ref 150–400)
RBC: 3.88 MIL/uL (ref 3.87–5.11)
RDW: 18.4 % — AB (ref 11.5–15.5)
WBC: 7.2 10*3/uL (ref 4.0–10.5)

## 2017-04-25 LAB — COMPREHENSIVE METABOLIC PANEL
ALBUMIN: 3.9 g/dL (ref 3.5–5.0)
ALK PHOS: 49 U/L (ref 38–126)
ALT: 14 U/L (ref 14–54)
AST: 25 U/L (ref 15–41)
Anion gap: 10 (ref 5–15)
BILIRUBIN TOTAL: 0.5 mg/dL (ref 0.3–1.2)
BUN: 11 mg/dL (ref 6–20)
CO2: 23 mmol/L (ref 22–32)
CREATININE: 0.61 mg/dL (ref 0.44–1.00)
Calcium: 9.1 mg/dL (ref 8.9–10.3)
Chloride: 107 mmol/L (ref 101–111)
GFR calc Af Amer: >60 mL/min (ref >60)
GLUCOSE: 86 mg/dL (ref 65–99)
POTASSIUM: 3.7 mmol/L (ref 3.5–5.1)
Sodium: 140 mmol/L (ref 135–145)
TOTAL PROTEIN: 7.8 g/dL (ref 6.5–8.1)

## 2017-04-25 LAB — WET PREP, GENITAL
SPERM: NONE SEEN
TRICH WET PREP: NONE SEEN
Yeast Wet Prep HPF POC: NONE SEEN

## 2017-04-25 LAB — TYPE AND SCREEN
ABO/RH(D): O POS
ANTIBODY SCREEN: NEGATIVE

## 2017-04-25 LAB — GC/CHLAMYDIA PROBE AMP (~~LOC~~) NOT AT ARMC
Chlamydia: NEGATIVE
Neisseria Gonorrhea: NEGATIVE

## 2017-04-25 LAB — PREGNANCY, URINE: PREG TEST UR: POSITIVE — AB

## 2017-04-25 LAB — ABO/RH: ABO/RH(D): O POS

## 2017-04-25 LAB — HCG, QUANTITATIVE, PREGNANCY: hCG, Beta Chain, Quant, S: 8963 m[IU]/mL — ABNORMAL HIGH (ref <5)

## 2017-04-25 NOTE — Discharge Instructions (Signed)
He was seen in the ER for vaginal bleeding.  Blood work confirms that you are pregnant, however ultrasound is not able to clearly describe the staging of your pregnancy.  Given the uncertainty of the ultrasound, ectopic pregnancy is still possible.  Please read the instructions provided on ectopic pregnancy and return to the ER immediately if you start having worsening of the bleeding or start having severe pains, dizziness, fainting.  Most likely this is evidence of threatened miscarriage -which means you are at high risk of having miscarriage early new pregnancy, and also at risk of having premature delivery.  Please see the OB doctors in 2 weeks.

## 2017-04-25 NOTE — ED Notes (Signed)
Pelvic supplies at beside.

## 2017-04-25 NOTE — ED Provider Notes (Signed)
[facility]-EMERGENCY DEPT Provider Note   CSN: [id number] Arrival date & time: 04/24/17  2148     History   Chief Complaint Chief Complaint  Patient presents with  . Threatened Miscarriage    HPI Alison Gomez is a 32 y.o. female.  HPI 32 year old female comes in with chief complaint of vaginal bleeding.  Patient is G4 P2 and had an abortion before.  Patient states that she found out she was pregnant recently.  She was working out earlier today and started having blood gushing out.  Patient denies any significant pain in her abdomen or back.   Past Medical History:  Diagnosis Date  . Chest pain   . Herpes genitalia   . Panic attack   . Previous cesarean delivery affecting pregnancy 01/10/2011  . Trichomonas     Patient Active Problem List   Diagnosis Date Noted  . Costochondritis 10/28/2013  . GERD (gastroesophageal reflux disease) 10/28/2013  . Anxiety 10/28/2013  . Hyperthyroidism 05/19/2013  . Microcytic anemia 01/23/2013  . Acute pericarditis 01/01/2013  . Chest pain   . S/P cesarean section 01/10/2011    Past Surgical History:  Procedure Laterality Date  . CESAREAN SECTION  01/10/2011   Procedure: CESAREAN SECTION;  Surgeon: Sherron Monday, MD;  Location: [facility];  Service: Gynecology;  Laterality: N/A;  repeat  c/s  . INDUCED ABORTION    . TOOTH EXTRACTION       OB History    Gravida  5   Para  2   Term  2   Preterm      AB  1   Living  2     SAB      TAB  1   Ectopic      Multiple      Live Births  2            Home Medications    Prior to Admission medications   Medication Sig Start Date End Date Taking? Authorizing Provider  acetaminophen (TYLENOL) 500 MG tablet Take 1,000 mg by mouth daily as needed for moderate pain or headache.    [provider]  colchicine 0.6 MG tablet Take 1 tablet (0.6 mg total) by mouth 2 (two) times daily. Take this for 2 months only 12/30/14   Lars Masson, MD    HYDROcodone-acetaminophen (NORCO/VICODIN) 5-325 MG tablet Take 1 tablet by mouth every 4 (four) hours as needed. 04/19/15   Hedges, Tinnie Gens, PA-C  ibuprofen (ADVIL,MOTRIN) 200 MG tablet Take 400 mg by mouth daily as needed for headache or moderate pain.    [provider]  LORazepam (ATIVAN) 1 MG tablet Take 1 mg by mouth 2 (two) times daily as needed for anxiety.  05/08/13   [provider]  naproxen sodium (ANAPROX) 220 MG tablet Take 440 mg by mouth daily as needed (pain).    [provider]  QUEtiapine Fumarate (SEROQUEL XR) 150 MG 24 hr tablet Take 150 mg by mouth at bedtime. 04/15/15   [provider]  sertraline (ZOLOFT) 100 MG tablet Take 100 mg by mouth daily.    [provider]  tiZANidine (ZANAFLEX) 2 MG tablet Take 2 mg by mouth every 8 (eight) hours as needed for muscle spasms.  04/13/15   [provider]    Family History Family History  Problem Relation Age of Onset  . Liver cancer Father   . Stroke Other   . Anesthesia problems Neg Hx  Social History Social History   Tobacco Use  . Smoking status: Current Some Day Smoker  . Smokeless tobacco: Never Used  Substance Use Topics  . Alcohol use: Yes    Comment: sometimes   . Drug use: No     Allergies   Chocolate   Review of Systems Review of Systems  Constitutional: Positive for activity change.  Gastrointestinal: Positive for abdominal pain.  Genitourinary: Positive for pelvic pain and vaginal bleeding. Negative for flank pain.  Allergic/Immunologic: Negative for immunocompromised state.  Hematological: Does not bruise/bleed easily.  All other systems reviewed and are negative.    Physical Exam Updated Vital Signs BP 121/79 (BP Location: Left Arm)   Pulse (!) 56   Temp 98.3 F (36.8 C) (Oral)   Resp 16   Ht 5\' 9"  (1.753 m)   Wt 97.1 kg (214 lb)   LMP 03/20/2017   SpO2 100%   BMI 31.60 kg/m   Physical Exam  Constitutional: She is oriented to  person, place, and time. She appears well-developed.  HENT:  Head: Normocephalic and atraumatic.  Eyes: Pupils are equal, round, and reactive to light. Conjunctivae and EOM are normal.  Neck: Normal range of motion. Neck supple.  Cardiovascular: Normal rate, regular rhythm, normal heart sounds and intact distal pulses.  No murmur heard. Pulmonary/Chest: Effort normal. No respiratory distress. She has no wheezes.  Abdominal: Soft. Bowel sounds are normal. She exhibits no distension. There is no tenderness. There is no rebound and no guarding.  Genitourinary: Vagina normal and uterus normal.  Genitourinary Comments: External exam - normal, no lesions Speculum exam: Pt has some white discharge, +++ blood Bimanual exam: Patient has no CMT, no adnexal tenderness or fullness and cervical os is closed  Neurological: She is alert and oriented to person, place, and time.  Skin: Skin is warm and dry.  Nursing note and vitals reviewed.    ED Treatments / Results  Labs (all labs ordered are listed, but only abnormal results are displayed) Labs Reviewed  WET PREP, GENITAL - Abnormal; Notable for the following components:      Result Value   Clue Cells Wet Prep HPF POC PRESENT (*)    WBC, Wet Prep HPF POC FEW (*)    All other components within normal limits  HCG, QUANTITATIVE, PREGNANCY - Abnormal; Notable for the following components:   hCG, Beta Chain, Quant, S 8,963 (*)    All other components within normal limits  CBC WITH DIFFERENTIAL/PLATELET - Abnormal; Notable for the following components:   Hemoglobin 9.7 (*)    HCT 31.1 (*)    MCH 25.0 (*)    RDW 18.4 (*)    All other components within normal limits  PREGNANCY, URINE - Abnormal; Notable for the following components:   Preg Test, Ur POSITIVE (*)    All other components within normal limits  COMPREHENSIVE METABOLIC PANEL  TYPE AND SCREEN  ABO/RH  GC/CHLAMYDIA PROBE AMP ([place]) NOT AT [facility]    EKG None  Radiology US Ob  Comp Less 14 Wks  Result Date: 04/25/2017 CLINICAL DATA:  Pelvic pain for 1 day, vaginal bleeding. Beta HCG 8963. EXAM: OBSTETRIC <14 WK [place] AND TRANSVAGINAL OB US TECHNIQUE: Both transabdominal and transvaginal ultrasound examinations were performed for complete evaluation of the gestation as well as the maternal uterus, adnexal regions, and pelvic cul-de-sac. Transvaginal technique was performed to assess early pregnancy. COMPARISON:  None. FINDINGS: Intrauterine gestational sac: Present though located in the lower uterine segment with  teardrop morphology. Yolk sac: Present though somewhat linear in appearance, potentially reflecting embryo. Cardiac Activity: At applicable. MSD: 11 mm   5 w   6 d Subchorionic hemorrhage:  Moderate. Maternal uterus/adnexae: Normal appearance of the adnexa. 2.2 cm LEFT corpus luteal cyst. IMPRESSION: Probable early intrauterine gestational sac with abnormal morphology and, within lower uterine segment. Possible yolk sac versus fetal pole. Moderate subchorionic hemorrhage. Recommend follow-up quantitative B-HCG levels and follow-up US in 14 days to confirm and assess viability. This recommendation follows SRU consensus guidelines: Diagnostic Criteria for Nonviable Pregnancy Early in the First Trimester. Malva Limes Med 2013; 161:0960-45. Electronically Signed   By: Awilda Metro M.D.   On: 04/25/2017 05:40   US Ob Transvaginal  Result Date: 04/25/2017 CLINICAL DATA:  Pelvic pain for 1 day, vaginal bleeding. Beta HCG 8963. EXAM: OBSTETRIC <14 WK [place] AND TRANSVAGINAL OB US TECHNIQUE: Both transabdominal and transvaginal ultrasound examinations were performed for complete evaluation of the gestation as well as the maternal uterus, adnexal regions, and pelvic cul-de-sac. Transvaginal technique was performed to assess early pregnancy. COMPARISON:  None. FINDINGS: Intrauterine gestational sac: Present though located in the lower uterine segment with teardrop morphology. Yolk sac:  Present though somewhat linear in appearance, potentially reflecting embryo. Cardiac Activity: At applicable. MSD: 11 mm   5 w   6 d Subchorionic hemorrhage:  Moderate. Maternal uterus/adnexae: Normal appearance of the adnexa. 2.2 cm LEFT corpus luteal cyst. IMPRESSION: Probable early intrauterine gestational sac with abnormal morphology and, within lower uterine segment. Possible yolk sac versus fetal pole. Moderate subchorionic hemorrhage. Recommend follow-up quantitative B-HCG levels and follow-up US in 14 days to confirm and assess viability. This recommendation follows SRU consensus guidelines: Diagnostic Criteria for Nonviable Pregnancy Early in the First Trimester. Malva Limes Med 2013; 409:8119-14. Electronically Signed   By: Awilda Metro M.D.   On: 04/25/2017 05:40    Procedures Procedures (including critical care time)  Medications Ordered in ED Medications - No data to display   Initial Impression / Assessment and Plan / ED Course  I have reviewed the triage vital signs and the nursing notes.  Pertinent labs & imaging results that were available during my care of the patient were reviewed by me and considered in my medical decision making (see chart for details).  Clinical Course as of Apr 25 616  Wed Apr 25, 2017  0612 Patient's ultrasound shows probable gestational sac and yolk sac. No clear evidence of IUP.  Patient's main complaint is vaginal bleeding and not severe pain, and the bleeding is not severe therefore concerns for ectopic are lower in the differential at this time.  Results from the ER workup discussed with the patient face to face and all questions answered to the best of my ability.  Patient will see OB doctor in 2 weeks for reassessment.  She will return to the ER if her bleeding gets worse or if she starts having severe abdominal pain.  US OB Comp Less 14 Wks [AN]    Clinical Course User Index [AN] Derwood Kaplan, MD    32 year old female comes in with  chief complaint of vaginal bleeding  DDx includes: Ectopic pregnancy Threatened abortion Inevitable abortion Miscarriage Spontaneous abortion Trauma/cerval laceration  Patient comes in with chief complaint of vaginal bleeding.  She appears to be in first trimester based on history.  Appropriate labs ordered.  Patient is Rh+ therefore she will not need RhoGam.  Pelvic exam and ultrasound pending.   Final Clinical Impressions(s) /  ED Diagnoses   Final diagnoses:  First trimester bleeding  Threatened miscarriage    ED Discharge Orders    None       Derwood KaplanNanavati, Curtina Grills, MD 04/26/17 [phone]

## 2017-04-25 NOTE — ED Notes (Signed)
Vital signs delayed, pt currently having IV placed.

## 2017-05-01 ENCOUNTER — Inpatient Hospital Stay (HOSPITAL_COMMUNITY): Payer: BLUE CROSS/BLUE SHIELD

## 2017-05-01 ENCOUNTER — Encounter (HOSPITAL_COMMUNITY): Payer: Self-pay

## 2017-05-01 ENCOUNTER — Inpatient Hospital Stay (HOSPITAL_COMMUNITY)
Admission: AD | Admit: 2017-05-01 | Discharge: 2017-05-01 | Disposition: A | Discharge status: Home / Self Care | Payer: BLUE CROSS/BLUE SHIELD | Source: Ambulatory Visit | Attending: Obstetrics and Gynecology | Admitting: Obstetrics and Gynecology

## 2017-05-01 DIAGNOSIS — Z3A01 Less than 8 weeks gestation of pregnancy: Secondary | ICD-10-CM | POA: Diagnosis not present

## 2017-05-01 DIAGNOSIS — Z3A00 Weeks of gestation of pregnancy not specified: Secondary | ICD-10-CM | POA: Diagnosis not present

## 2017-05-01 DIAGNOSIS — O209 Hemorrhage in early pregnancy, unspecified: Secondary | ICD-10-CM | POA: Diagnosis not present

## 2017-05-01 DIAGNOSIS — O039 Complete or unspecified spontaneous abortion without complication: Secondary | ICD-10-CM

## 2017-05-01 HISTORY — DX: Anemia, unspecified: D64.9

## 2017-05-01 LAB — CBC
HCT: 29.3 % — ABNORMAL LOW (ref 36.0–46.0)
HEMOGLOBIN: 9.2 g/dL — AB (ref 12.0–15.0)
MCH: 24.9 pg — ABNORMAL LOW (ref 26.0–34.0)
MCHC: 31.4 g/dL (ref 30.0–36.0)
MCV: 79.4 fL (ref 78.0–100.0)
PLATELETS: 322 10*3/uL (ref 150–400)
RBC: 3.69 MIL/uL — AB (ref 3.87–5.11)
RDW: 18.5 % — ABNORMAL HIGH (ref 11.5–15.5)
WBC: 5.5 10*3/uL (ref 4.0–10.5)

## 2017-05-01 LAB — URINALYSIS, ROUTINE W REFLEX MICROSCOPIC
BILIRUBIN URINE: NEGATIVE
Glucose, UA: NEGATIVE mg/dL
Hgb urine dipstick: NEGATIVE
KETONES UR: NEGATIVE mg/dL
LEUKOCYTES UA: NEGATIVE
NITRITE: NEGATIVE
PH: 6 (ref 5.0–8.0)
PROTEIN: NEGATIVE mg/dL
Specific Gravity, Urine: 1.02 (ref 1.005–1.030)

## 2017-05-01 LAB — HCG, QUANTITATIVE, PREGNANCY: HCG, BETA CHAIN, QUANT, S: 4680 m[IU]/mL — AB (ref <5)

## 2017-05-01 MED ORDER — MISOPROSTOL 200 MCG PO TABS: 600.0000 ug | ORAL_TABLET | Freq: Once | ORAL | 0 refills | Status: DC

## 2017-05-01 MED ORDER — FERROUS SULFATE 325 (65 FE) MG PO TABS: 325.0000 mg | ORAL_TABLET | Freq: Every day | ORAL | Status: DC

## 2017-05-01 MED ORDER — TRAMADOL HCL 50 MG PO TABS: 50.0000 mg | ORAL_TABLET | Freq: Four times a day (QID) | ORAL | 0 refills | Status: DC | PRN

## 2017-05-01 MED ORDER — IBUPROFEN 600 MG PO TABS: 600.0000 mg | ORAL_TABLET | Freq: Four times a day (QID) | ORAL | 0 refills | Status: DC | PRN

## 2017-05-01 NOTE — Discharge Instructions (Signed)
Follow up in the office in one week.  Expect the office to call you but if you do not get a call from them in 2 days, call the office.

## 2017-05-01 NOTE — MAU Provider Note (Signed)
Chief Complaint: Vaginal Bleeding and Abdominal Pain   First Provider Initiated Contact with Patient 05/01/17 1009      SUBJECTIVE HPI: Alison Gomez is a 32 y.o. [id number] at [redacted]w[redacted]d by LMP who presents to maternity admissions reporting vaginal bleeding and abdominal pain. She was seen on 04/25/17 in the ED and had labwork and [place] with results not conclusive for IUP. She reports the bleeding is not heavy but different from when it started, and now has some pieces that look like tissue in it.  Her pain is low in her abdomen and intermittent. She has not tried any treatments for pain. There are no other associated symptoms. She presented to an office for termination of the pregnancy today and someone outside the clinic stopped her and told her she may want to go to the hospital instead because of her symptoms. She denies vaginal itching/burning, urinary symptoms, h/a, dizziness, n/v, or fever/chills.     HPI  Past Medical History:  Diagnosis Date  . Anemia   . Chest pain   . Herpes genitalia   . Panic attack   . Previous cesarean delivery affecting pregnancy 01/10/2011  . Trichomonas    Past Surgical History:  Procedure Laterality Date  . CESAREAN SECTION  01/10/2011   Procedure: CESAREAN SECTION;  Surgeon: Sherron Monday, MD;  Location: [facility];  Service: Gynecology;  Laterality: N/A;  repeat  c/s  . INDUCED ABORTION    . TOOTH EXTRACTION     Social History   Socioeconomic History  . Marital status: Single    Spouse name: Not on file  . Number of children: Not on file  . Years of education: Not on file  . Highest education level: Not on file  Occupational History    Employer: [employer]  Social Needs  . Financial resource strain: Not on file  . Food insecurity:    Worry: Not on file    Inability: Not on file  . Transportation needs:    Medical: Not on file    Non-medical: Not on file  Tobacco Use  . Smoking status: Current Some Day Smoker  . Smokeless tobacco: Never Used  Substance  and Sexual Activity  . Alcohol use: Yes    Comment: sometimes   . Drug use: No  . Sexual activity: Yes    Birth control/protection: None  Lifestyle  . Physical activity:    Days per week: Not on file    Minutes per session: Not on file  . Stress: Not on file  Relationships  . Social connections:    Talks on phone: Not on file    Gets together: Not on file    Attends religious service: Not on file    Active member of club or organization: Not on file    Attends meetings of clubs or organizations: Not on file    Relationship status: Not on file  . Intimate partner violence:    Fear of current or ex partner: Not on file    Emotionally abused: Not on file    Physically abused: Not on file    Forced sexual activity: Not on file  Other Topics Concern  . Not on file  Social History Narrative  . Not on file   No current facility-administered medications on file prior to encounter.    Current Outpatient Medications on File Prior to Encounter  Medication Sig Dispense Refill  . acetaminophen (TYLENOL) 500 MG tablet Take 1,000 mg by mouth daily as  needed for moderate pain or headache.    Marland Kitchen LORazepam (ATIVAN) 1 MG tablet Take 1 mg by mouth 2 (two) times daily as needed for anxiety.     Marland Kitchen QUEtiapine Fumarate (SEROQUEL XR) 150 MG 24 hr tablet Take 150 mg by mouth at bedtime.  2   Allergies  Allergen Reactions  . Chocolate Anaphylaxis    ROS:  Review of Systems  Constitutional: Negative for chills, fatigue and fever.  Respiratory: Negative for shortness of breath.   Cardiovascular: Negative for chest pain.  Gastrointestinal: Negative for constipation, nausea and vomiting.  Genitourinary: Positive for pelvic pain and vaginal bleeding. Negative for difficulty urinating, dysuria, flank pain, vaginal discharge and vaginal pain.  Neurological: Negative for dizziness and headaches.  Psychiatric/Behavioral: Negative.      I have reviewed patient's Past Medical Hx, Surgical Hx, Family  Hx, Social Hx, medications and allergies.   Physical Exam   Patient Vitals for the past 24 hrs:  BP Temp Temp src Pulse Resp SpO2 Weight  05/01/17 1255 (!) 145/94 - - 61 - - -  05/01/17 1254 - - - - 16 - -  05/01/17 0915 (!) 149/69 98.1 F (36.7 C) Oral 75 16 100 % 215 lb (97.5 kg)   Constitutional: Well-developed, well-nourished female in no acute distress.  Cardiovascular: normal rate Respiratory: normal effort GI: Abd soft, non-tender. Pos BS x 4 MS: Extremities nontender, no edema, normal ROM Neurologic: Alert and oriented x 4.  GU: Neg CVAT.  PELVIC EXAM: Deferred, pad examined and scant brown bleeding   LAB RESULTS Results for orders placed or performed during the hospital encounter of 05/01/17 (from the past 24 hour(s))  Urinalysis, Routine w reflex microscopic     Status: None   Collection Time: 05/01/17  9:11 AM  Result Value Ref Range   Color, Urine YELLOW YELLOW   APPearance CLEAR CLEAR   Specific Gravity, Urine 1.020 1.005 - 1.030   pH 6.0 5.0 - 8.0   Glucose, UA NEGATIVE NEGATIVE mg/dL   Hgb urine dipstick NEGATIVE NEGATIVE   Bilirubin Urine NEGATIVE NEGATIVE   Ketones, ur NEGATIVE NEGATIVE mg/dL   Protein, ur NEGATIVE NEGATIVE mg/dL   Nitrite NEGATIVE NEGATIVE   Leukocytes, UA NEGATIVE NEGATIVE  CBC     Status: Abnormal   Collection Time: 05/01/17 10:28 AM  Result Value Ref Range   WBC 5.5 4.0 - 10.5 K/uL   RBC 3.69 (L) 3.87 - 5.11 MIL/uL   Hemoglobin 9.2 (L) 12.0 - 15.0 g/dL   HCT 29.5 (L) 62.1 - 30.8 %   MCV 79.4 78.0 - 100.0 fL   MCH 24.9 (L) 26.0 - 34.0 pg   MCHC 31.4 30.0 - 36.0 g/dL   RDW 65.7 (H) 84.6 - 96.2 %   Platelets 322 150 - 400 K/uL  hCG, quantitative, pregnancy     Status: Abnormal   Collection Time: 05/01/17 10:28 AM  Result Value Ref Range   hCG, Beta Chain, Quant, S 4,680 (H) <5 mIU/mL    --/--/O POS (04/24 0325)  IMAGING US Ob Comp Less 14 Wks  Result Date: 04/25/2017 CLINICAL DATA:  Pelvic pain for 1 day, vaginal  bleeding. Beta HCG 8963. EXAM: OBSTETRIC <14 WK [place] AND TRANSVAGINAL OB US TECHNIQUE: Both transabdominal and transvaginal ultrasound examinations were performed for complete evaluation of the gestation as well as the maternal uterus, adnexal regions, and pelvic cul-de-sac. Transvaginal technique was performed to assess early pregnancy. COMPARISON:  None. FINDINGS: Intrauterine gestational sac: Present though located in  the lower uterine segment with teardrop morphology. Yolk sac: Present though somewhat linear in appearance, potentially reflecting embryo. Cardiac Activity: At applicable. MSD: 11 mm   5 w   6 d Subchorionic hemorrhage:  Moderate. Maternal uterus/adnexae: Normal appearance of the adnexa. 2.2 cm LEFT corpus luteal cyst. IMPRESSION: Probable early intrauterine gestational sac with abnormal morphology and, within lower uterine segment. Possible yolk sac versus fetal pole. Moderate subchorionic hemorrhage. Recommend follow-up quantitative B-HCG levels and follow-up US in 14 days to confirm and assess viability. This recommendation follows SRU consensus guidelines: Diagnostic Criteria for Nonviable Pregnancy Early in the First Trimester. Malva Limes Med 2013; 161:0960-45. Electronically Signed   By: Awilda Metro M.D.   On: 04/25/2017 05:40   US Ob Transvaginal  Result Date: 05/01/2017 CLINICAL DATA:  Increased bleeding, cramping EXAM: TRANSVAGINAL OB ULTRASOUND TECHNIQUE: Transvaginal ultrasound was performed for complete evaluation of the gestation as well as the maternal uterus, adnexal regions, and pelvic cul-de-sac. COMPARISON:  04/25/2017 FINDINGS: Intrauterine gestational sac: No longer visualized. Yolk sac:  Not visualized Embryo:  Not visualized Cardiac Activity: Heart Rate:  bpm MSD:   mm    w     d CRL:     mm    w  d                  [place] EDC: Subchorionic hemorrhage:  N/A Maternal uterus/adnexae: Previously seen gestational sac in the lower uterine segment no longer visualized. There is  enlarging complex fluid noted in the endometrium within the fundus in the area of previously seen hemorrhage, presumably blood products. IMPRESSION: The previously seen gestational sac in the lower uterine segment a longer visualized. Increasing complex fluid noted within the endometrium in the fundus, likely blood products. Electronically Signed   By: Charlett Nose M.D.   On: 05/01/2017 11:39   US Ob Transvaginal  Result Date: 04/25/2017 CLINICAL DATA:  Pelvic pain for 1 day, vaginal bleeding. Beta HCG 8963. EXAM: OBSTETRIC <14 WK [place] AND TRANSVAGINAL OB US TECHNIQUE: Both transabdominal and transvaginal ultrasound examinations were performed for complete evaluation of the gestation as well as the maternal uterus, adnexal regions, and pelvic cul-de-sac. Transvaginal technique was performed to assess early pregnancy. COMPARISON:  None. FINDINGS: Intrauterine gestational sac: Present though located in the lower uterine segment with teardrop morphology. Yolk sac: Present though somewhat linear in appearance, potentially reflecting embryo. Cardiac Activity: At applicable. MSD: 11 mm   5 w   6 d Subchorionic hemorrhage:  Moderate. Maternal uterus/adnexae: Normal appearance of the adnexa. 2.2 cm LEFT corpus luteal cyst. IMPRESSION: Probable early intrauterine gestational sac with abnormal morphology and, within lower uterine segment. Possible yolk sac versus fetal pole. Moderate subchorionic hemorrhage. Recommend follow-up quantitative B-HCG levels and follow-up US in 14 days to confirm and assess viability. This recommendation follows SRU consensus guidelines: Diagnostic Criteria for Nonviable Pregnancy Early in the First Trimester. Malva Limes Med 2013; 409:8119-14. Electronically Signed   By: Awilda Metro M.D.   On: 04/25/2017 05:40    MAU Management/MDM: Ordered labs and [place] and reviewed results. Quant hcg dropped by 50% and [place] with no gestational sac today definitive for SAB.  Since US showed blood with clots  in lower uterine segment and pt with minimal bleeding, offered Cytotec to shorten duration of miscarriage.  Discussed benefits/risks/alternatives, including expectant management.  Pt prefers Cytotec per vagina.  Hgb today is 9.2, pt without symptoms of anemia.  Rx for Cytotec 600 mg PV, ibuprofen 600  mg Q 6 hours, Tramadol 25 mg Q 6 hours PRN x 6 tabs, and ferrous sulfate 325 mg daily. Pt to continue iron x 1 month, and recommend women's multivitamin daily for maintenance.  Pt to f/u in office in 1-2 weeks, return to MAU with emergencies.   Consult Dr Jackelyn Knife with assessment and findings.  Ptt discharged with strict bleeding precautions.  ASSESSMENT 1. Miscarriage   2. Vaginal bleeding in pregnancy, first trimester     PLAN Discharge home Allergies as of 05/01/2017      Reactions   Chocolate Anaphylaxis      Medication List    STOP taking these medications   colchicine 0.6 MG tablet   HYDROcodone-acetaminophen 5-325 MG tablet Commonly known as:  NORCO/VICODIN   naproxen sodium 220 MG tablet Commonly known as:  ALEVE   sertraline 100 MG tablet Commonly known as:  ZOLOFT   tiZANidine 2 MG tablet Commonly known as:  ZANAFLEX     TAKE these medications   acetaminophen 500 MG tablet Commonly known as:  TYLENOL Take 1,000 mg by mouth daily as needed for moderate pain or headache.   ibuprofen 600 MG tablet Commonly known as:  ADVIL,MOTRIN Take 1 tablet (600 mg total) by mouth every 6 (six) hours as needed. What changed:    medication strength  how much to take  when to take this  reasons to take this   LORazepam 1 MG tablet Commonly known as:  ATIVAN Take 1 mg by mouth 2 (two) times daily as needed for anxiety.   misoprostol 200 MCG tablet Commonly known as:  CYTOTEC Take 3 tablets (600 mcg total) by mouth once for 1 dose.   QUEtiapine Fumarate 150 MG 24 hr tablet Commonly known as:  SEROQUEL XR Take 150 mg by mouth at bedtime.   traMADol 50 MG tablet Commonly  known as:  ULTRAM Take 1 tablet (50 mg total) by mouth every 6 (six) hours as needed.        Sharen Counter Certified Nurse-Midwife 05/01/2017  2:33 PM

## 2017-05-01 NOTE — MAU Note (Addendum)
Pt went to the ED and was told she was having a miscarriage. Has had increased bleeding and abdominal pain 6/10. Pt states she was going this morning to get an abortion to "get it all out" but was stopped outside the clinic and someone suggested she go the the hospital.

## 2017-05-11 DIAGNOSIS — O039 Complete or unspecified spontaneous abortion without complication: Secondary | ICD-10-CM | POA: Diagnosis not present

## 2017-05-11 DIAGNOSIS — Z309 Encounter for contraceptive management, unspecified: Secondary | ICD-10-CM | POA: Diagnosis not present

## 2017-05-17 DIAGNOSIS — O034 Incomplete spontaneous abortion without complication: Secondary | ICD-10-CM | POA: Diagnosis not present

## 2017-05-21 DIAGNOSIS — J0100 Acute maxillary sinusitis, unspecified: Secondary | ICD-10-CM | POA: Diagnosis not present

## 2017-05-21 DIAGNOSIS — R509 Fever, unspecified: Secondary | ICD-10-CM | POA: Diagnosis not present

## 2017-05-24 DIAGNOSIS — N898 Other specified noninflammatory disorders of vagina: Secondary | ICD-10-CM | POA: Diagnosis not present

## 2017-05-24 DIAGNOSIS — N390 Urinary tract infection, site not specified: Secondary | ICD-10-CM | POA: Diagnosis not present

## 2017-05-24 DIAGNOSIS — O034 Incomplete spontaneous abortion without complication: Secondary | ICD-10-CM | POA: Diagnosis not present

## 2017-06-01 ENCOUNTER — Encounter (HOSPITAL_BASED_OUTPATIENT_CLINIC_OR_DEPARTMENT_OTHER): Payer: Self-pay | Admitting: *Deleted

## 2017-06-04 ENCOUNTER — Encounter (HOSPITAL_BASED_OUTPATIENT_CLINIC_OR_DEPARTMENT_OTHER): Payer: Self-pay | Admitting: *Deleted

## 2017-06-06 NOTE — Progress Notes (Signed)
Called and spoke w/ pt via phone for pre-op interview.  Pt stated she has to someone at dr bovard's office , stated she told them that she is still bleeding from miscarriage and comfortable having procedure yet wants wait 30 days.  Called and lvm for hannah, or scheduler for dr bovard, just to give her a heads up in case she was not aware.

## 2017-06-08 ENCOUNTER — Ambulatory Visit (HOSPITAL_BASED_OUTPATIENT_CLINIC_OR_DEPARTMENT_OTHER)
Admission: RE | Admit: 2017-06-08 | Payer: BLUE CROSS/BLUE SHIELD | Source: Ambulatory Visit | Admitting: Obstetrics and Gynecology

## 2017-06-08 ENCOUNTER — Inpatient Hospital Stay (HOSPITAL_COMMUNITY)
Admission: AD | Admit: 2017-06-08 | Discharge: 2017-06-08 | Disposition: A | Discharge status: Home / Self Care | Payer: BLUE CROSS/BLUE SHIELD | Source: Ambulatory Visit | Attending: Obstetrics and Gynecology | Admitting: Obstetrics and Gynecology

## 2017-06-08 ENCOUNTER — Encounter (HOSPITAL_COMMUNITY): Payer: Self-pay

## 2017-06-08 ENCOUNTER — Inpatient Hospital Stay (HOSPITAL_COMMUNITY): Payer: BLUE CROSS/BLUE SHIELD

## 2017-06-08 DIAGNOSIS — Z3A00 Weeks of gestation of pregnancy not specified: Secondary | ICD-10-CM | POA: Diagnosis not present

## 2017-06-08 DIAGNOSIS — O021 Missed abortion: Secondary | ICD-10-CM | POA: Diagnosis not present

## 2017-06-08 DIAGNOSIS — O209 Hemorrhage in early pregnancy, unspecified: Secondary | ICD-10-CM | POA: Diagnosis not present

## 2017-06-08 DIAGNOSIS — O283 Abnormal ultrasonic finding on antenatal screening of mother: Secondary | ICD-10-CM | POA: Diagnosis not present

## 2017-06-08 DIAGNOSIS — O208 Other hemorrhage in early pregnancy: Secondary | ICD-10-CM

## 2017-06-08 DIAGNOSIS — O3680X0 Pregnancy with inconclusive fetal viability, not applicable or unspecified: Secondary | ICD-10-CM

## 2017-06-08 HISTORY — DX: Personal history of other infectious and parasitic diseases: Z86.19

## 2017-06-08 HISTORY — DX: Personal history of other diseases of the circulatory system: Z86.79

## 2017-06-08 HISTORY — DX: Personal history of other mental and behavioral disorders: Z86.59

## 2017-06-08 HISTORY — DX: Herpesviral infection of urogenital system, unspecified: A60.00

## 2017-06-08 LAB — URINALYSIS, ROUTINE W REFLEX MICROSCOPIC
BACTERIA UA: NONE SEEN
BILIRUBIN URINE: NEGATIVE
GLUCOSE, UA: NEGATIVE mg/dL
KETONES UR: NEGATIVE mg/dL
Leukocytes, UA: NEGATIVE
NITRITE: NEGATIVE
PROTEIN: NEGATIVE mg/dL
Specific Gravity, Urine: 1.02 (ref 1.005–1.030)
pH: 8 (ref 5.0–8.0)

## 2017-06-08 LAB — CBC WITH DIFFERENTIAL/PLATELET
Basophils Absolute: 0 10*3/uL (ref 0.0–0.1)
Basophils Relative: 0 %
Eosinophils Absolute: 0.1 10*3/uL (ref 0.0–0.7)
Eosinophils Relative: 1 %
HEMATOCRIT: 28.9 % — AB (ref 36.0–46.0)
HEMOGLOBIN: 9.2 g/dL — AB (ref 12.0–15.0)
LYMPHS ABS: 2.5 10*3/uL (ref 0.7–4.0)
LYMPHS PCT: 37 %
MCH: 25.8 pg — AB (ref 26.0–34.0)
MCHC: 31.8 g/dL (ref 30.0–36.0)
MCV: 81.2 fL (ref 78.0–100.0)
MONO ABS: 0.2 10*3/uL (ref 0.1–1.0)
MONOS PCT: 3 %
NEUTROS ABS: 4 10*3/uL (ref 1.7–7.7)
NEUTROS PCT: 59 %
Platelets: 340 10*3/uL (ref 150–400)
RBC: 3.56 MIL/uL — ABNORMAL LOW (ref 3.87–5.11)
RDW: 19.1 % — AB (ref 11.5–15.5)
WBC: 6.8 10*3/uL (ref 4.0–10.5)

## 2017-06-08 LAB — WET PREP, GENITAL
Clue Cells Wet Prep HPF POC: NONE SEEN
Sperm: NONE SEEN
Trich, Wet Prep: NONE SEEN
YEAST WET PREP: NONE SEEN

## 2017-06-08 LAB — POCT PREGNANCY, URINE: PREG TEST UR: POSITIVE — AB

## 2017-06-08 LAB — HCG, QUANTITATIVE, PREGNANCY: hCG, Beta Chain, Quant, S: 332 m[IU]/mL — ABNORMAL HIGH (ref <5)

## 2017-06-08 SURGERY — LIGATION, FALLOPIAN TUBE, LAPAROSCOPIC
Anesthesia: General | Laterality: Bilateral

## 2017-06-08 NOTE — MAU Note (Signed)
Was seen in MAU for miscarriage April 30. Was given Cytotec.  Pt reported Hcg level had dropped to 1500 on 5/24. Went to doctor today d/t continued heavy bleeding (2 pads/hr) with "large clots."  MD said Hcg levels were not where expected although pt doesn't know what level is. Was told to come to MAU for further evaluation.

## 2017-06-08 NOTE — MAU Provider Note (Addendum)
History     CSN: [id number]  Arrival date and time: 06/08/17 4098   First Provider Initiated Contact with Patient 06/08/17 1935      Chief Complaint  Patient presents with  . Vaginal Bleeding  . Dizziness   HPI  Alison Gomez is a 32 y.o. [phone] patient who presents to MAU for heavy vaginal bleeding. PMH includes miscarriage at 11 weeks identified 05/01/2017.  Pt reports heavy bleeding with clots "all day", "soaking through a pad in 90 minutes". Denies lightheadedness, falls. States she had a fever earlier in May and was treated with antibiotics. Reports she had penetrative intercourse yesterday, states it did not trigger additional bleeding or abdominal discomfort.  Patient states she saw Dr. Ellyn Hack this morning, and a member of staff told her "you're bleeding too much and need to [facility]".  Patient reports she completed the treatment prescribed for her Bacterial Vaginosis and UTI.  Pertinent Gynecological History: Menses: heavy since miscarriage, only two weeks free of bleeding since end of April 2019 Bleeding: dysfunctional uterine bleeding Contraception: none DES exposure: unknown Blood transfusions: none Sexually transmitted diseases: no past history and currently at risk Previous GYN Procedures: N/A  Last mammogram: N/A age 81  Last pap: normal Date: 2019 per patient report   Past Medical History:  Diagnosis Date  . Anemia   . Genital HSV   . History of panic attacks   . History of pericarditis 2014   followed by cardiologist-  dr Tobias Alexander--- pleuritic chest pain-- per note CT 2014 found small cardial effusion w/ rub, probable pericarditis and pleuresy/  per note rub resolved 03/ 2015/   recurrent acute percarditis 12-30-2014 restarted on colchicine for 2 months  . History of trichomonal vaginitis     Past Surgical History:  Procedure Laterality Date  . CESAREAN SECTION  01/10/2011   Procedure: CESAREAN SECTION;  Surgeon: Sherron Monday, MD;  Location: [facility];  Service: Gynecology;  Laterality: N/A;  repeat  c/s  . INDUCED ABORTION    . TOOTH EXTRACTION    . TRANSTHORACIC ECHOCARDIOGRAM  06/02/2014   ef 50-55%/  trivial MR and PR/  moderate TR/  mild RAE/  small pericardal effusion was identified    Family History  Problem Relation Age of Onset  . Liver cancer Father   . Stroke Other   . Anesthesia problems Neg Hx     Social History   Tobacco Use  . Smoking status: Current Some Day Smoker  . Smokeless tobacco: Never Used  Substance Use Topics  . Alcohol use: Yes    Comment: sometimes   . Drug use: No    Allergies:  Allergies  Allergen Reactions  . Chocolate Anaphylaxis    Medications Prior to Admission  Medication Sig Dispense Refill Last Dose  . ibuprofen (ADVIL,MOTRIN) 600 MG tablet Take 1 tablet (600 mg total) by mouth every 6 (six) hours as needed. 30 tablet 0 06/08/2017 at Unknown time  . LORazepam (ATIVAN) 1 MG tablet Take 1 mg by mouth 2 (two) times daily as needed for anxiety.    06/07/2017 at Unknown time  . QUEtiapine Fumarate (SEROQUEL XR) 150 MG 24 hr tablet Take 150 mg by mouth at bedtime.  2 06/07/2017 at Unknown time  . acetaminophen (TYLENOL) 500 MG tablet Take 1,000 mg by mouth daily as needed for moderate pain or headache.   04/09/2015  . misoprostol (CYTOTEC) 200 MCG tablet Take 3 tablets (600 mcg total) by mouth  once for 1 dose. 3 tablet 0   . traMADol (ULTRAM) 50 MG tablet Take 1 tablet (50 mg total) by mouth every 6 (six) hours as needed. 6 tablet 0     Review of Systems  Constitutional: Negative for activity change, appetite change and fever.       Reports remote fever r/t possible flu in May 2019  Respiratory: Negative for shortness of breath and wheezing.   Cardiovascular: Negative for chest pain.  Genitourinary: Positive for vaginal bleeding. Negative for decreased urine volume, difficulty urinating, pelvic pain, urgency, vaginal discharge and vaginal pain.  Neurological:  Negative for dizziness, weakness and light-headedness.   Physical Exam   Blood pressure 130/81, pulse 73, temperature 98.2 F (36.8 C), temperature source Oral, resp. rate 18, last menstrual period 03/20/2017, SpO2 100 %.  Physical Exam  Nursing note and vitals reviewed. Constitutional: She is oriented to person, place, and time. She appears well-developed and well-nourished.  Cardiovascular: Normal rate, regular rhythm, normal heart sounds and intact distal pulses.  Respiratory: Effort normal and breath sounds normal.  GI: Soft. Bowel sounds are normal.  Genitourinary: Uterus normal. There is no rash, tenderness, lesion or injury on the right labia. There is no rash, tenderness, lesion or injury on the left labia. Cervix exhibits no motion tenderness, no discharge and no friability. Right adnexum displays no tenderness and no fullness. Left adnexum displays no mass, no tenderness and no fullness. There is bleeding in the vagina.  Musculoskeletal: Normal range of motion.  Neurological: She is alert and oriented to person, place, and time. She has normal reflexes.  Skin: Skin is warm and dry.  Psychiatric: She has a normal mood and affect. Her behavior is normal. Judgment and thought content normal.   Scant frank red blood present on pad. Pt assisted with pad change after speculum exam at approximately 1950.  MAU Course  Procedures  MDM Orders Placed This Encounter  Procedures  . Wet prep, genital    Standing Status:   Standing    Number of Occurrences:   1  . Urinalysis, Routine w reflex microscopic    Standing Status:   Standing    Number of Occurrences:   1  . CBC with Differential/Platelet    Standing Status:   Standing    Number of Occurrences:   1  . hCG, quantitative, pregnancy    Standing Status:   Standing    Number of Occurrences:   1  . RPR    Standing Status:   Standing    Number of Occurrences:   1  . Pregnancy, urine    Standing Status:   Standing    Number of  Occurrences:   1   Results for orders placed or performed during the hospital encounter of 06/08/17 (from the past 24 hour(s))  Wet prep, genital     Status: Abnormal   Collection Time: 06/08/17  7:41 PM  Result Value Ref Range   Yeast Wet Prep HPF POC NONE SEEN NONE SEEN   Trich, Wet Prep NONE SEEN NONE SEEN   Clue Cells Wet Prep HPF POC NONE SEEN NONE SEEN   WBC, Wet Prep HPF POC FEW (A) NONE SEEN   Sperm NONE SEEN   Urinalysis, Routine w reflex microscopic     Status: Abnormal   Collection Time: 06/08/17  7:55 PM  Result Value Ref Range   Color, Urine YELLOW YELLOW   APPearance CLEAR CLEAR   Specific Gravity, Urine 1.020 1.005 - 1.030  pH 8.0 5.0 - 8.0   Glucose, UA NEGATIVE NEGATIVE mg/dL   Hgb urine dipstick SMALL (A) NEGATIVE   Bilirubin Urine NEGATIVE NEGATIVE   Ketones, ur NEGATIVE NEGATIVE mg/dL   Protein, ur NEGATIVE NEGATIVE mg/dL   Nitrite NEGATIVE NEGATIVE   Leukocytes, UA NEGATIVE NEGATIVE   RBC / HPF >50 (H) 0 - 5 RBC/hpf   WBC, UA 0-5 0 - 5 WBC/hpf   Bacteria, UA NONE SEEN NONE SEEN   Squamous Epithelial / LPF 0-5 0 - 5   Mucus PRESENT   CBC with Differential/Platelet     Status: Abnormal   Collection Time: 06/08/17  8:04 PM  Result Value Ref Range   WBC 6.8 4.0 - 10.5 K/uL   RBC 3.56 (L) 3.87 - 5.11 MIL/uL   Hemoglobin 9.2 (L) 12.0 - 15.0 g/dL   HCT 16.128.9 (L) 09.636.0 - 04.546.0 %   MCV 81.2 78.0 - 100.0 fL   MCH 25.8 (L) 26.0 - 34.0 pg   MCHC 31.8 30.0 - 36.0 g/dL   RDW 40.919.1 (H) 81.111.5 - 91.415.5 %   Platelets 340 150 - 400 K/uL   Neutrophils Relative % 59 %   Neutro Abs 4.0 1.7 - 7.7 K/uL   Lymphocytes Relative 37 %   Lymphs Abs 2.5 0.7 - 4.0 K/uL   Monocytes Relative 3 %   Monocytes Absolute 0.2 0.1 - 1.0 K/uL   Eosinophils Relative 1 %   Eosinophils Absolute 0.1 0.0 - 0.7 K/uL   Basophils Relative 0 %   Basophils Absolute 0.0 0.0 - 0.1 K/uL  hCG, quantitative, pregnancy     Status: Abnormal   Collection Time: 06/08/17  8:04 PM  Result Value Ref Range    hCG, Beta Chain, Quant, S 332 (H) <5 mIU/mL  Pregnancy, urine POC     Status: Abnormal   Collection Time: 06/08/17  8:05 PM  Result Value Ref Range   Preg Test, Ur POSITIVE (A) NEGATIVE   [place] Ob Transvaginal  Result Date: 06/08/2017 CLINICAL DATA:  Vaginal bleeding. Patient had abortion 05/01/2017 with subsequent Cytotec. Quantitative beta HCG 04/25/2017 is 8963, 05/01/2017 is 4680. Quantitative beta HCG today 322. Positive urine pregnancy test. Assess for retained products of conception. EXAM: OBSTETRIC <14 WK ULTRASOUND TECHNIQUE: Transabdominal ultrasound was performed for evaluation of the gestation as well as the maternal uterus and adnexal regions. COMPARISON:  05/01/2017 FINDINGS: Intrauterine gestational sac: Not visualized. Yolk sac:  Not visualized. Embryo:  Not visualized. Cardiac Activity: Not visualized. Heart Rate: Not visualized.  Bpm MSD: Not visualized. CRL:   Not visualized. Subchorionic hemorrhage:  None visualized. Maternal uterus/adnexae: Uterus is normal size, shape position as the endometrium is within normal in thickness measuring 12.4 mm. No evidence of mass or fluid/debris within the endometrium. No significant vascularity over the endometrium. No free pelvic fluid. IMPRESSION: Findings compatible recent abortion. No definite evidence of retained products of conception. Electronically Signed   By: Elberta Fortisaniel  Boyle M.D.   On: 06/08/2017 21:37   Assessment and Plan  A: --Visit note for pre-op interview dated 06/06/2017 in which patient told Dr. Emeline DarlingBovard's staff she wanted 30 days before undergoing laparoscopic tubal ligation -Care turned over to C. Astoria Condon CNM at 2100.  Calvert CantorSamantha C Weinhold, CNM 06/08/2017, 8:01 PM   Due to unprotected intercourse during last several weeks after miscarriage, repeat u/s ordered to rule out ectopic pregnancy.  Consulted with Dr. Jackelyn KnifeMeisinger with results- will discharge patient home to follow up in the office on Monday/Tuesday for repeat HCG.  1.  Pregnancy of unknown anatomic location   2. Vaginal bleeding affecting early pregnancy    -Discharge home in stable condition -Strict ectopic precautions discussed -Patient advised to follow-up with [facility] on Monday for repeat blood work. -Patient may return to MAU as needed or if her condition were to change or worsen  Rolm Bookbinder, [place] 06/08/17 10:04 PM

## 2017-06-09 LAB — RPR: RPR: NONREACTIVE

## 2017-06-11 LAB — GC/CHLAMYDIA PROBE AMP (~~LOC~~) NOT AT ARMC
Chlamydia: NEGATIVE
Neisseria Gonorrhea: NEGATIVE

## 2017-06-14 DIAGNOSIS — O021 Missed abortion: Secondary | ICD-10-CM | POA: Diagnosis not present

## 2017-06-14 DIAGNOSIS — Z130 Encounter for screening for diseases of the blood and blood-forming organs and certain disorders involving the immune mechanism: Secondary | ICD-10-CM | POA: Diagnosis not present

## 2017-06-14 DIAGNOSIS — F3175 Bipolar disorder, in partial remission, most recent episode depressed: Secondary | ICD-10-CM | POA: Diagnosis not present

## 2017-06-21 DIAGNOSIS — O021 Missed abortion: Secondary | ICD-10-CM | POA: Diagnosis not present

## 2017-06-28 DIAGNOSIS — O021 Missed abortion: Secondary | ICD-10-CM | POA: Diagnosis not present

## 2017-07-12 ENCOUNTER — Encounter: Payer: Self-pay | Admitting: Cardiology

## 2017-07-16 ENCOUNTER — Encounter: Payer: Self-pay | Admitting: Obstetrics and Gynecology

## 2017-07-16 DIAGNOSIS — Z789 Other specified health status: Secondary | ICD-10-CM | POA: Diagnosis not present

## 2017-07-16 DIAGNOSIS — O034 Incomplete spontaneous abortion without complication: Secondary | ICD-10-CM | POA: Diagnosis not present

## 2017-07-16 DIAGNOSIS — Z302 Encounter for sterilization: Secondary | ICD-10-CM

## 2017-07-16 HISTORY — DX: Encounter for sterilization: Z30.2

## 2017-07-31 ENCOUNTER — Ambulatory Visit: Payer: BLUE CROSS/BLUE SHIELD | Admitting: Cardiology

## 2017-07-31 DIAGNOSIS — R0989 Other specified symptoms and signs involving the circulatory and respiratory systems: Secondary | ICD-10-CM

## 2017-08-01 NOTE — Patient Instructions (Addendum)
Your procedure is scheduled on: Wednesday, Aug 7  [facility] at: 6:30 am  Pick up the phone at the desk and dial [phone].  Call this number if you have problems the morning of surgery: [phone].  Remember: Do NOT eat food or Do NOT drink clear liquids (including water) after midnight Tuesday.  Take these medicines the morning of surgery with a SIP OF WATER:  Lorazepam if needed  Brush your teeth on day of surgery.  Stop herbal medications, vitamin supplements, Ibuprofen/NSAIDS at this time.  Do NOT wear jewelry (body piercing), metal hair clips/bobby pins, make-up, or nail polish. Do NOT wear lotions, powders, or perfumes.  You may wear deoderant. Do NOT shave for 48 hours prior to surgery. Do NOT bring valuables to the hospital.  Have a responsible adult drive you home and stay with you for 24 hours after your procedure.  Home with Mother [place]

## 2017-08-03 ENCOUNTER — Encounter (HOSPITAL_COMMUNITY)
Admission: RE | Admit: 2017-08-03 | Discharge: 2017-08-03 | Disposition: A | Discharge status: Home / Self Care | Payer: BLUE CROSS/BLUE SHIELD | Source: Ambulatory Visit | Attending: Obstetrics and Gynecology | Admitting: Obstetrics and Gynecology

## 2017-08-03 ENCOUNTER — Other Ambulatory Visit: Payer: Self-pay

## 2017-08-03 ENCOUNTER — Encounter (HOSPITAL_COMMUNITY): Payer: Self-pay | Admitting: *Deleted

## 2017-08-03 DIAGNOSIS — Z01812 Encounter for preprocedural laboratory examination: Secondary | ICD-10-CM | POA: Diagnosis not present

## 2017-08-03 HISTORY — DX: Dermatitis, unspecified: L30.9

## 2017-08-03 HISTORY — DX: Major depressive disorder, single episode, unspecified: F32.9

## 2017-08-03 HISTORY — DX: Bipolar disorder, unspecified: F31.9

## 2017-08-03 HISTORY — DX: Anxiety disorder, unspecified: F41.9

## 2017-08-03 HISTORY — DX: Depression, unspecified: F32.A

## 2017-08-03 LAB — CBC
HCT: 31.6 % — ABNORMAL LOW (ref 36.0–46.0)
HEMOGLOBIN: 10 g/dL — AB (ref 12.0–15.0)
MCH: 25.6 pg — ABNORMAL LOW (ref 26.0–34.0)
MCHC: 31.6 g/dL (ref 30.0–36.0)
MCV: 81 fL (ref 78.0–100.0)
PLATELETS: 324 10*3/uL (ref 150–400)
RBC: 3.9 MIL/uL (ref 3.87–5.11)
RDW: 16.8 % — ABNORMAL HIGH (ref 11.5–15.5)
WBC: 6.2 10*3/uL (ref 4.0–10.5)

## 2017-08-08 ENCOUNTER — Ambulatory Visit (HOSPITAL_COMMUNITY)
Admission: AD | Admit: 2017-08-08 | Payer: BLUE CROSS/BLUE SHIELD | Source: Ambulatory Visit | Admitting: Obstetrics and Gynecology

## 2017-08-08 ENCOUNTER — Encounter: Payer: Self-pay | Admitting: Cardiology

## 2017-08-08 HISTORY — DX: Encounter for sterilization: Z30.2

## 2017-08-08 SURGERY — LIGATION, FALLOPIAN TUBE, LAPAROSCOPIC
Anesthesia: General | Laterality: Bilateral

## 2017-09-06 DIAGNOSIS — F3175 Bipolar disorder, in partial remission, most recent episode depressed: Secondary | ICD-10-CM | POA: Diagnosis not present

## 2018-01-14 ENCOUNTER — Encounter: Payer: Self-pay | Admitting: Emergency Medicine

## 2018-01-14 DIAGNOSIS — F319 Bipolar disorder, unspecified: Secondary | ICD-10-CM | POA: Insufficient documentation

## 2018-02-21 ENCOUNTER — Ambulatory Visit: Payer: BLUE CROSS/BLUE SHIELD | Admitting: Physician Assistant

## 2018-02-21 ENCOUNTER — Encounter: Payer: Self-pay | Admitting: Physician Assistant

## 2018-02-21 DIAGNOSIS — F411 Generalized anxiety disorder: Secondary | ICD-10-CM | POA: Diagnosis not present

## 2018-02-21 DIAGNOSIS — F319 Bipolar disorder, unspecified: Secondary | ICD-10-CM | POA: Diagnosis not present

## 2018-02-21 MED ORDER — QUETIAPINE FUMARATE ER 300 MG PO TB24: 300.0000 mg | ORAL_TABLET | Freq: Every day | ORAL | 5 refills | Status: DC

## 2018-02-21 MED ORDER — LORAZEPAM 1 MG PO TABS: 1.0000 mg | ORAL_TABLET | Freq: Two times a day (BID) | ORAL | 5 refills | Status: DC | PRN

## 2018-02-21 NOTE — Progress Notes (Signed)
[facility]  Patient ID: Alison Gomez,  MRN: [id number]  PCP: Ileana Ladd, MD  Date of Evaluation: 02/21/2018 Time spent:15 minutes  Chief Complaint:  Chief Complaint    Follow-up; Anxiety      HISTORY/CURRENT STATUS: HPI Here for routine 6 month med check.  Doing a lot better.  Not needing the Ativan as often now.  Usually only twice a day.  Also is able to feel an anxiety attack coming on and knows how to work through it, at least most of the time.  Able to control it better.  Patient denies loss of interest in usual activities and is able to enjoy things.  Denies decreased energy or motivation.  Appetite has not changed.  No extreme sadness, tearfulness, or feelings of hopelessness.  Denies any changes in concentration, making decisions or remembering things.  Denies suicidal or homicidal thoughts.  Patient denies increased energy with decreased need for sleep, no increased talkativeness, no racing thoughts, no impulsivity or risky behaviors, no increased spending, no increased libido, no grandiosity.  Sleeps good.  Not too much.  Denies muscle or joint pain, stiffness, or dystonia.  Denies dizziness, syncope, seizures, numbness, tingling, tremor, tics, unsteady gait, slurred speech, confusion.   Individual Medical History/ Review of Systems: Changes? :No   Allergies: Chocolate  Current Medications:  Current Outpatient Medications:  .  acetaminophen (TYLENOL) 500 MG tablet, Take 1,000 mg by mouth daily as needed for moderate pain or headache., Disp: , Rfl:  .  ibuprofen (ADVIL,MOTRIN) 600 MG tablet, Take 1 tablet (600 mg total) by mouth every 6 (six) hours as needed., Disp: 30 tablet, Rfl: 0 .  LORazepam (ATIVAN) 1 MG tablet, Take 1 tablet (1 mg total) by mouth 2 (two) times daily as needed for anxiety. With occas extra 1/2-1 during day., Disp: 75 tablet, Rfl: 5 .  QUEtiapine (SEROQUEL XR) 300 MG 24 hr tablet, Take 1 tablet (300 mg total) by mouth at  bedtime., Disp: 30 tablet, Rfl: 5 .  misoprostol (CYTOTEC) 200 MCG tablet, Take 3 tablets (600 mcg total) by mouth once for 1 dose., Disp: 3 tablet, Rfl: 0 .  traMADol (ULTRAM) 50 MG tablet, Take 1 tablet (50 mg total) by mouth every 6 (six) hours as needed. (Patient not taking: Reported on 02/21/2018), Disp: 6 tablet, Rfl: 0 Medication Side Effects: none  Family Medical/ Social History: Changes? No  MENTAL HEALTH EXAM:  Last menstrual period 03/20/2017.There is no height or weight on file to calculate BMI.  General Appearance: Casual and Well Groomed  Eye Contact:  Good  Speech:  Clear and Coherent  Volume:  Normal  Mood:  Euthymic  Affect:  Appropriate  Thought Process:  Goal Directed  Orientation:  Full (Time, Place, and Person)  Thought Content: Logical   Suicidal Thoughts:  No  Homicidal Thoughts:  No  Memory:  WNL  Judgement:  Good  Insight:  Good  Psychomotor Activity:  Normal  Concentration:  Concentration: Good  Recall:  Good  Fund of Knowledge: Good  Language: Good  Assets:  Desire for Improvement  ADL's:  Intact  Cognition: WNL  Prognosis:  Good    DIAGNOSES:    ICD-10-CM   1. Bipolar I disorder (HCC) F31.9   2. Generalized anxiety disorder F41.1     Receiving Psychotherapy: No    RECOMMENDATIONS: I am glad to see her doing so well!. Continue Seroquel XR 300 mg p.o. nightly. Continue Ativan 1 mg p.o. twice daily as needed with  an occasional half to 1 in midday as needed. We discussed the fact that she is not on any birth control.  If she is not planning to get pregnant, she really should be on something.  We talked about the fact that these medications possibly could cause birth defects or risk of miscarriage.  She verbalizes understanding and will contact her GYN. She had labs through her job within the past couple of months, checking her blood sugar and cholesterol.  She will have those records faxed to me. Return in 6 months or sooner as  needed.   Melony Overly, PA-C

## 2018-08-22 ENCOUNTER — Ambulatory Visit (INDEPENDENT_AMBULATORY_CARE_PROVIDER_SITE_OTHER): Payer: BC Managed Care – PPO | Admitting: Physician Assistant

## 2018-08-22 ENCOUNTER — Other Ambulatory Visit: Payer: Self-pay

## 2018-08-22 ENCOUNTER — Encounter: Payer: Self-pay | Admitting: Physician Assistant

## 2018-08-22 DIAGNOSIS — F411 Generalized anxiety disorder: Secondary | ICD-10-CM | POA: Diagnosis not present

## 2018-08-22 DIAGNOSIS — F319 Bipolar disorder, unspecified: Secondary | ICD-10-CM | POA: Diagnosis not present

## 2018-08-22 MED ORDER — BUSPIRONE HCL 15 MG PO TABS: ORAL_TABLET | ORAL | 1 refills | Status: DC

## 2018-08-22 NOTE — Progress Notes (Signed)
[facility]  Patient ID: Alison Gomez,  MRN: [id number]  PCP: Vernie Shanks, MD  Date of Evaluation: 08/22/2018 Time spent:15 minutes  Chief Complaint:  Chief Complaint    Anxiety; Fatigue     Virtual Visit via Telephone Note  I connected with patient by a video enabled telemedicine application or telephone, with their informed consent, and verified patient privacy and that I am speaking with the correct person using two identifiers.  I am private, in my home and the patient is home.   I discussed the limitations, risks, security and privacy concerns of performing an evaluation and management service by telephone and the availability of in person appointments. I also discussed with the patient that there may be a patient responsible charge related to this service. The patient expressed understanding and agreed to proceed.   I discussed the assessment and treatment plan with the patient. The patient was provided an opportunity to ask questions and all were answered. The patient agreed with the plan and demonstrated an understanding of the instructions.   The patient was advised to call back or seek an in-person evaluation if the symptoms worsen or if the condition fails to improve as anticipated.  I provided 15 minutes of non-face-to-face time during this encounter.  HISTORY/CURRENT STATUS: HPI Anxiety is worse.  More overwhelmed for several reasons.  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 She's working from home, trying to keep the kids on track, "I'm hanging on by a thread." Ativan works but she's been having to take it more often, usually 2 per day.  And that makes her tired.  Sleeps okay.  She called out of work today because she is so anxious.  States she needs a day to herself.  Her mom has  her kids today doing their school work there.  Patient denies loss of interest in usual activities and is able to enjoy things.  Denies decreased energy or motivation.  Appetite has not changed.  No extreme sadness, tearfulness, or feelings of hopelessness.  Denies any changes in concentration, making decisions or remembering things.  Denies suicidal or homicidal thoughts.  Patient denies increased energy with decreased need for sleep, no increased talkativeness, no racing thoughts, no impulsivity or risky behaviors, no increased spending, no increased libido, no grandiosity. Has been more snappy but seems to be triggered by the things that are going on.  Denies dizziness, syncope, seizures, numbness, tingling, tremor, tics, unsteady gait, slurred speech, confusion. Denies muscle or joint pain, stiffness, or dystonia.  Individual Medical History/ Review of Systems: Changes? :No    Past medications for mental health diagnoses include: Xanax, Seroquel  Allergies: Chocolate  Current Medications:  Current Outpatient Medications:  .  acetaminophen (TYLENOL) 500 MG tablet, Take 1,000 mg by mouth daily as needed for moderate pain or headache., Disp: , Rfl:  .  ibuprofen (ADVIL,MOTRIN) 600 MG tablet, Take 1 tablet (600 mg total) by mouth every 6 (six) hours as needed., Disp: 30 tablet, Rfl: 0 .  LORazepam (ATIVAN) 1 MG tablet, Take 1 tablet (1 mg total) by mouth 2 (two) times daily as needed for anxiety. With occas extra 1/2-1 during day., Disp: 75 tablet, Rfl: 5 .  QUEtiapine (SEROQUEL XR) 300 MG 24 hr tablet, Take 1 tablet (300 mg total) by mouth at  bedtime., Disp: 30 tablet, Rfl: 5 .  busPIRone (BUSPAR) 15 MG tablet, 1/3 po bid for 1 week, then 2/3 po bid for 1 week, then 1 po bid., Disp: 60 tablet, Rfl: 1 .  misoprostol (CYTOTEC) 200 MCG tablet, Take 3 tablets (600 mcg total) by mouth once for 1 dose., Disp: 3 tablet, Rfl: 0 .  traMADol (ULTRAM) 50 MG tablet, Take 1 tablet (50 mg total) by mouth  every 6 (six) hours as needed. (Patient not taking: Reported on 02/21/2018), Disp: 6 tablet, Rfl: 0 Medication Side Effects: none  Family Medical/ Social History: Changes? No  MENTAL HEALTH EXAM:  There were no vitals taken for this visit.There is no height or weight on file to calculate BMI.  General Appearance: Unable to assess  Eye Contact:  Unable to assess  Speech:  Clear and Coherent  Volume:  Normal  Mood:  Anxious  Affect:  Unable to assess  Thought Process:  Goal Directed  Orientation:  Full (Time, Place, and Person)  Thought Content: Logical   Suicidal Thoughts:  No  Homicidal Thoughts:  No  Memory:  WNL  Judgement:  Good  Insight:  Good  Psychomotor Activity:  Unable to assess  Concentration:  Concentration: Good  Recall:  Good  Fund of Knowledge: Good  Language: Good  Assets:  Desire for Improvement  ADL's:  Intact  Cognition: WNL  Prognosis:  Good    DIAGNOSES:    ICD-10-CM   1. Generalized anxiety disorder  F41.1   2. Bipolar I disorder (HCC)  F31.9     Receiving Psychotherapy: No    RECOMMENDATIONS:  PDMP was reviewed. We discussed different options for treatment of the anxiety.  We agree that we should not increase the Ativan because it sometimes makes her sleepy.  It is helpful when she takes it however.  We talked briefly about the antidepressants that can also help with anxiety.  But because of the bipolar disorder I have to be careful with those so that hopefully it would not push her into a manic state.  Another option would be used to start BuSpar.  We discussed the benefits, risks, side effects and she accepts. Start BuSpar 15 mg 1/3 tablet twice daily for 1 week, then increase to 2/3 tablet twice daily for 1 week, then increase to 1 tablet twice daily for anxiety. Continue Ativan 1 mg, 1/2-1 twice daily as needed with an occasional extra half during the day as needed. Continue Seroquel XR 300 mg nightly. Consider therapy. Return in 4 to 6  weeks.  Melony Overlyeresa Rahman Ferrall, PA-C   This record has been created using [employer] software.  Chart creation errors have been sought, but may not always have been located and corrected. Such creation errors do not reflect on the standard of medical care.

## 2018-09-26 ENCOUNTER — Encounter: Payer: Self-pay | Admitting: Physician Assistant

## 2018-09-26 ENCOUNTER — Other Ambulatory Visit: Payer: Self-pay

## 2018-09-26 ENCOUNTER — Ambulatory Visit (INDEPENDENT_AMBULATORY_CARE_PROVIDER_SITE_OTHER): Payer: BC Managed Care – PPO | Admitting: Physician Assistant

## 2018-09-26 DIAGNOSIS — F411 Generalized anxiety disorder: Secondary | ICD-10-CM

## 2018-09-26 DIAGNOSIS — F319 Bipolar disorder, unspecified: Secondary | ICD-10-CM | POA: Diagnosis not present

## 2018-09-26 MED ORDER — QUETIAPINE FUMARATE ER 300 MG PO TB24: 300.0000 mg | ORAL_TABLET | Freq: Every day | ORAL | 5 refills | Status: DC

## 2018-09-26 MED ORDER — LORAZEPAM 1 MG PO TABS: 1.0000 mg | ORAL_TABLET | Freq: Two times a day (BID) | ORAL | 5 refills | Status: DC | PRN

## 2018-09-26 NOTE — Progress Notes (Signed)
[facility]  Patient ID: Alison Gomez,  MRN: [id number]  PCP: Ileana Ladd, MD  Date of Evaluation: 09/26/2018 Time spent:15 minutes  Chief Complaint:  Chief Complaint    Anxiety; Follow-up     Virtual Visit via Telephone Note  I connected with patient by a video enabled telemedicine application or telephone, with their informed consent, and verified patient privacy and that I am speaking with the correct person using two identifiers.  I am private, in my office and the patient is home.  I discussed the limitations, risks, security and privacy concerns of performing an evaluation and management service by telephone and the availability of in person appointments. I also discussed with the patient that there may be a patient responsible charge related to this service. The patient expressed understanding and agreed to proceed.   I discussed the assessment and treatment plan with the patient. The patient was provided an opportunity to ask questions and all were answered. The patient agreed with the plan and demonstrated an understanding of the instructions.   The patient was advised to call back or seek an in-person evaluation if the symptoms worsen or if the condition fails to improve as anticipated.  I provided 15 minutes of non-face-to-face time during this encounter.  HISTORY/CURRENT STATUS: HPI For routine med check.  Is overwhelmed.  In a managerial position at work now and although she likes it it is super stressful.  Also kids are doing school online, pt is working hard, has Chief Financial Officer going on, so is really stressed out.   She was not able to tolerate the BuSpar.  It caused severe headaches.  She tried to push through them but states they were relentless so she finally quit the BuSpar and the headaches went away.  The Ativan is helping a lot. Energy is low b/c she's stressing so much.  She sleeps good but then wakes up and does not feel refreshed about  half of the time.  She is able to enjoy things when she gets the chance.  She likes to travel though and has not been able to do that because of the coronavirus pandemic.  Over the past few weeks she has taken a couple of half days off because she was so stressed.  Has FMLA already.  She does cry sometimes but not all the time.  She is not isolating.  Denies suicidal or homicidal thoughts.  Patient denies increased energy with decreased need for sleep, no increased talkativeness, no racing thoughts, no impulsivity or risky behaviors, no increased spending, no increased libido, no grandiosity.  No hallucinations.  No increased irritability.  Denies dizziness, syncope, seizures, numbness, tingling, tremor, tics, unsteady gait, slurred speech, confusion. Denies muscle or joint pain, stiffness, or dystonia.  Individual Medical History/ Review of Systems: Changes? :No    Past medications for mental health diagnoses include: Xanax, Seroquel, BuSpar caused severe headache  Allergies: Chocolate  Current Medications:  Current Outpatient Medications:  .  acetaminophen (TYLENOL) 500 MG tablet, Take 1,000 mg by mouth daily as needed for moderate pain or headache., Disp: , Rfl:  .  ibuprofen (ADVIL,MOTRIN) 600 MG tablet, Take 1 tablet (600 mg total) by mouth every 6 (six) hours as needed., Disp: 30 tablet, Rfl: 0 .  LORazepam (ATIVAN) 1 MG tablet, Take 1 tablet (1 mg total) by mouth 2 (two) times daily as needed for anxiety. With occas extra 1/2-1 during day., Disp: 75 tablet, Rfl: 5 .  QUEtiapine (SEROQUEL XR) 300  MG 24 hr tablet, Take 1 tablet (300 mg total) by mouth at bedtime., Disp: 30 tablet, Rfl: 5 .  misoprostol (CYTOTEC) 200 MCG tablet, Take 3 tablets (600 mcg total) by mouth once for 1 dose., Disp: 3 tablet, Rfl: 0 .  traMADol (ULTRAM) 50 MG tablet, Take 1 tablet (50 mg total) by mouth every 6 (six) hours as needed. (Patient not taking: Reported on 02/21/2018), Disp: 6 tablet, Rfl: 0 Medication Side  Effects: headache  Family Medical/ Social History: Changes? No  MENTAL HEALTH EXAM:  There were no vitals taken for this visit.There is no height or weight on file to calculate BMI.  General Appearance: unable to assess  Eye Contact:  None  Speech:  unable to assess  Volume:  Normal  Mood:  Euphoric  Affect:  Appropriate  Thought Process:  Goal Directed and Descriptions of Associations: Intact  Orientation:  Full (Time, Place, and Person)  Thought Content: Logical   Suicidal Thoughts:  No  Homicidal Thoughts:  No  Memory:  WNL  Judgement:  Good  Insight:  Good  Psychomotor Activity:  unable to assess  Concentration:  Concentration: Good  Recall:  Good  Fund of Knowledge: Good  Language: Good  Assets:  Desire for Improvement  ADL's:  Intact  Cognition: WNL  Prognosis:  Good    DIAGNOSES:    ICD-10-CM   1. Bipolar I disorder ([place])  F31.9   2. Generalized anxiety disorder  F41.1     Receiving Psychotherapy: No    RECOMMENDATIONS:  Recommend she get back in counseling. Take time for herself at least 30 minutes every day.  Also once a week take a day trip for do something else that she really enjoys.  That will help with stress and give her more adequate rest Continue Ativan 1 mg twice daily as needed with an occasional extra half during the day as needed. Continue Seroquel XR 300 mg nightly. PDMP was reviewed.  We discussed increasing one or the other of the above medications, however since most of what she is dealing with is circumstantial, I think counseling would be more beneficial for her.  She agrees and does not really want to increase the doses of the medicine.  She is afraid it will make her even more tired the next day. Return in 6 months or sooner as needed.  Donnal Moat, PA-C

## 2018-10-01 DIAGNOSIS — L732 Hidradenitis suppurativa: Secondary | ICD-10-CM | POA: Diagnosis not present

## 2018-10-01 DIAGNOSIS — H6122 Impacted cerumen, left ear: Secondary | ICD-10-CM | POA: Diagnosis not present

## 2018-10-11 DIAGNOSIS — L732 Hidradenitis suppurativa: Secondary | ICD-10-CM | POA: Diagnosis not present

## 2018-10-11 DIAGNOSIS — L218 Other seborrheic dermatitis: Secondary | ICD-10-CM | POA: Diagnosis not present

## 2019-02-05 DIAGNOSIS — N898 Other specified noninflammatory disorders of vagina: Secondary | ICD-10-CM | POA: Diagnosis not present

## 2019-03-19 DIAGNOSIS — Z01419 Encounter for gynecological examination (general) (routine) without abnormal findings: Secondary | ICD-10-CM | POA: Diagnosis not present

## 2019-03-19 DIAGNOSIS — Z6828 Body mass index (BMI) 28.0-28.9, adult: Secondary | ICD-10-CM | POA: Diagnosis not present

## 2019-03-19 DIAGNOSIS — Z1389 Encounter for screening for other disorder: Secondary | ICD-10-CM | POA: Diagnosis not present

## 2019-04-24 ENCOUNTER — Other Ambulatory Visit: Payer: Self-pay | Admitting: Physician Assistant

## 2019-04-24 NOTE — Telephone Encounter (Signed)
Last apt 09/2018 due back 6 months 

## 2019-06-06 ENCOUNTER — Telehealth: Payer: Self-pay | Admitting: Physician Assistant

## 2019-06-06 NOTE — Telephone Encounter (Signed)
Received FMLA forms 06/06/2019. Placed on Nurse desk.

## 2019-06-16 NOTE — Telephone Encounter (Signed)
Yes, she does.  Will one of you ladies please call her and make appt.  Thanks.

## 2019-06-18 NOTE — Telephone Encounter (Signed)
Yes the FMLA form will wait until after her visit.  She is 3 months past due for a routine visit and she must keep appointments.  Otherwise, I do not know how she is and cannot complete FMLA form until that information is known.

## 2019-07-03 ENCOUNTER — Other Ambulatory Visit: Payer: Self-pay

## 2019-07-03 ENCOUNTER — Ambulatory Visit (INDEPENDENT_AMBULATORY_CARE_PROVIDER_SITE_OTHER): Payer: BC Managed Care – PPO | Admitting: Physician Assistant

## 2019-07-03 ENCOUNTER — Encounter: Payer: Self-pay | Admitting: Physician Assistant

## 2019-07-03 DIAGNOSIS — F319 Bipolar disorder, unspecified: Secondary | ICD-10-CM

## 2019-07-03 DIAGNOSIS — F411 Generalized anxiety disorder: Secondary | ICD-10-CM

## 2019-07-03 MED ORDER — QUETIAPINE FUMARATE ER 150 MG PO TB24: 150.0000 mg | ORAL_TABLET | Freq: Every day | ORAL | 0 refills | Status: DC

## 2019-07-03 MED ORDER — LORAZEPAM 1 MG PO TABS: ORAL_TABLET | ORAL | 1 refills | Status: DC

## 2019-07-03 MED ORDER — QUETIAPINE FUMARATE ER 300 MG PO TB24: ORAL_TABLET | ORAL | 1 refills | Status: DC

## 2019-07-03 NOTE — Progress Notes (Addendum)
[facility]  Patient ID: JOURNI MOFFA,  MRN: [id number]  PCP: Ileana Ladd, MD  Date of Evaluation: 07/03/2019 Time spent:20 minutes  Chief Complaint:  Chief Complaint    Medication Refill      HISTORY/CURRENT STATUS: HPI For routine med check.  Had a miscarriage and had D&E last week. Unplanned pregnancy. Was taken off the Seroquel and Ativan and needs to get back on it.  Sad about the loss, but "everything happens for a reason so I'll be all right."  She is working, she is able to enjoy things but has definitely felt more "down" since being off of the Seroquel for a couple of months.  She has been more anxious as well and request a new prescription for the Ativan.  Energy and motivation are good.  Denies easy crying, attention and focus are good.  She is able to complete task in a timely manner.  Usually sleeps well.  Denies suicidal or homicidal thoughts.  Patient denies increased energy with decreased need for sleep, no increased talkativeness, no racing thoughts, no impulsivity or risky behaviors, no increased spending, no increased libido, no grandiosity.  No hallucinations.  No increased irritability.  Denies dizziness, syncope, seizures, numbness, tingling, tremor, tics, unsteady gait, slurred speech, confusion. Denies muscle or joint pain, stiffness, or dystonia.  Individual Medical History/ Review of Systems: Changes? :Yes D and E last week.  Past medications for mental health diagnoses include: Xanax, Seroquel, BuSpar caused severe headache  Allergies: Chocolate  Current Medications:  Current Outpatient Medications:  .  acetaminophen (TYLENOL) 500 MG tablet, Take 1,000 mg by mouth daily as needed for moderate pain or headache., Disp: , Rfl:  .  ferrous sulfate 325 (65 FE) MG tablet, Take 325 mg by mouth daily with breakfast., Disp: , Rfl:  .  ibuprofen (ADVIL,MOTRIN) 600 MG tablet, Take 1 tablet (600 mg total) by mouth every 6 (six) hours as needed.,  Disp: 30 tablet, Rfl: 0 .  LORazepam (ATIVAN) 1 MG tablet, TAKE 1 TABLET BY MOUTH TWICE DAILY AS NEEDED FOR ANXIETY; MAY TAKE OCCASIONAL EXTRA 1/2 TO 1 TABLET DURING THE DAY IF NEEDED, Disp: 75 tablet, Rfl: 1 .  misoprostol (CYTOTEC) 200 MCG tablet, Take 3 tablets (600 mcg total) by mouth once for 1 dose., Disp: 3 tablet, Rfl: 0 .  QUEtiapine (SEROQUEL XR) 300 MG 24 hr tablet, TAKE 1 TABLET(300 MG) BY MOUTH AT BEDTIME (start after finishing the Seroquel XR 150 mg), Disp: 30 tablet, Rfl: 1 .  QUEtiapine Fumarate (SEROQUEL XR) 150 MG 24 hr tablet, Take 1 tablet (150 mg total) by mouth at bedtime., Disp: 15 tablet, Rfl: 0 .  traMADol (ULTRAM) 50 MG tablet, Take 1 tablet (50 mg total) by mouth every 6 (six) hours as needed. (Patient not taking: Reported on 02/21/2018), Disp: 6 tablet, Rfl: 0 Medication Side Effects: none  Family Medical/ Social History: Changes? No  MENTAL HEALTH EXAM:  There were no vitals taken for this visit.There is no height or weight on file to calculate BMI.  General Appearance: Casual, Neat and Well Groomed  Eye Contact:  Good  Speech:  Clear and Coherent and Normal Rate  Volume:  Normal  Mood:  Euphoric  Affect:  Appropriate  Thought Process:  Goal Directed and Descriptions of Associations: Intact  Orientation:  Full (Time, Place, and Person)  Thought Content: Logical   Suicidal Thoughts:  No  Homicidal Thoughts:  No  Memory:  WNL  Judgement:  Good  Insight:  Good  Psychomotor Activity:  Normal  Concentration:  Concentration: Good and Attention Span: Good  Recall:  Good  Fund of Knowledge: Good  Language: Good  Assets:  Desire for Improvement  ADL's:  Intact  Cognition: WNL  Prognosis:  Good    DIAGNOSES:    ICD-10-CM   1. Bipolar I disorder (HCC)  F31.9   2. Generalized anxiety disorder  F41.1     Receiving Psychotherapy: No    RECOMMENDATIONS:  PDMP reviewed. My condolences on the miscarriage. I provided 20 minutes of face-to-face care during  this encounter. Restart Ativan 1 mg, 1 p.o. twice daily as needed with occasional extra half in middle of the day as needed. Restart Seroquel XR 150 mg, p.o. nightly for 2 weeks and then increase. Start Seroquel XR 300 mg, 1 p.o. nightly after 2 weeks of the 150 mg.  Patient understands. Consider counseling. Return in 6 weeks.  Addendum 07/24/2019 received records from [facility], Dr. Ellyn Hack.  Labs were requested from 01/03/2019 to present.  There were no results for CBC, CMP, or lipid panel.  Records show recent miscarriage with hCG levels, and UA results.  At next office visit, I will discuss with patient and we will need to order BMP, hemoglobin A1c, and fasting lipid panel since she is on an atypical antipsychotic.   Melony Overly, PA-C

## 2019-07-04 ENCOUNTER — Other Ambulatory Visit: Payer: Self-pay

## 2019-07-04 DIAGNOSIS — O039 Complete or unspecified spontaneous abortion without complication: Secondary | ICD-10-CM | POA: Diagnosis not present

## 2019-07-04 MED ORDER — QUETIAPINE FUMARATE ER 150 MG PO TB24: 150.0000 mg | ORAL_TABLET | Freq: Every day | ORAL | 0 refills | Status: DC

## 2019-07-04 MED ORDER — QUETIAPINE FUMARATE ER 300 MG PO TB24
ORAL_TABLET | ORAL | 0 refills | Status: DC
Start: 2019-07-04 — End: 2019-08-12

## 2019-07-08 DIAGNOSIS — Z0289 Encounter for other administrative examinations: Secondary | ICD-10-CM

## 2019-07-09 ENCOUNTER — Telehealth: Payer: Self-pay | Admitting: Physician Assistant

## 2019-07-09 NOTE — Telephone Encounter (Signed)
FMLA forms are signed and faxed to [facility].

## 2019-08-05 ENCOUNTER — Telehealth: Payer: Self-pay | Admitting: Physician Assistant

## 2019-08-05 NOTE — Telephone Encounter (Signed)
Alison Gomez called to report that she found a 30 day supply of the Seroquel XR 150mg .  So she will use those.

## 2019-08-05 NOTE — Telephone Encounter (Signed)
Alison Gomez called and said she is not sure if her Seroquil 300 mg is working for her.  She started with 150 mg. Has been on 300 mg for two weeks. She feels really tired now. Wants to know what she needs to do?

## 2019-08-05 NOTE — Telephone Encounter (Signed)
Patient taking the Seroquel Xr 300 mg currently and feels really tired. Asking if she could decrease back to 150 mg for a bit longer then increase dose? Pharmacy gave her #90 of the 300 mg XR tablet instead of the 150 mg. Asking if she could cut those? Has apt 08/12/19

## 2019-08-06 NOTE — Telephone Encounter (Signed)
Patient aware and had no further questions or concerns. She has apt on 08/12/19

## 2019-08-06 NOTE — Telephone Encounter (Signed)
Okay to take Seroquel XR 150 mg nightly and then we will discuss at her appointment next week.

## 2019-08-12 ENCOUNTER — Other Ambulatory Visit: Payer: Self-pay

## 2019-08-12 ENCOUNTER — Ambulatory Visit (INDEPENDENT_AMBULATORY_CARE_PROVIDER_SITE_OTHER): Payer: BC Managed Care – PPO | Admitting: Physician Assistant

## 2019-08-12 ENCOUNTER — Encounter: Payer: Self-pay | Admitting: Physician Assistant

## 2019-08-12 DIAGNOSIS — Z79899 Other long term (current) drug therapy: Secondary | ICD-10-CM | POA: Diagnosis not present

## 2019-08-12 DIAGNOSIS — F99 Mental disorder, not otherwise specified: Secondary | ICD-10-CM

## 2019-08-12 DIAGNOSIS — F411 Generalized anxiety disorder: Secondary | ICD-10-CM | POA: Diagnosis not present

## 2019-08-12 DIAGNOSIS — F5105 Insomnia due to other mental disorder: Secondary | ICD-10-CM

## 2019-08-12 DIAGNOSIS — F319 Bipolar disorder, unspecified: Secondary | ICD-10-CM | POA: Diagnosis not present

## 2019-08-12 MED ORDER — ZOLPIDEM TARTRATE 10 MG PO TABS: 10.0000 mg | ORAL_TABLET | Freq: Every evening | ORAL | 0 refills | Status: DC | PRN

## 2019-08-12 NOTE — Progress Notes (Signed)
[facility]  Patient ID: Alison Gomez,  MRN: [id number]  PCP: Ileana Ladd, MD  Date of Evaluation: 08/12/2019 Time spent:20 minutes  Chief Complaint:  Chief Complaint    Follow-up      HISTORY/CURRENT STATUS: Not doing well.   We restarted Seroquel about 6 weeks ago. It was too sedating so we decreased it. She's still really down, is working slower than nl, cries easily, working from home, but doesn't want to do much of anything except work. Not sleeping well, but when she was on the higher dose of Seroquel, was too groggy the next day.  She has a hard time falling asleep and then does not seem like she gets enough sleep.  No SI/HI.  Work is difficult b/c she will be thinking about one thing and then something else pops up. Forgets things a lot, but has only started this bad since the miscarriage earlier this year.  Do gets anxious but overall the Ativan is helping.  Patient denies increased energy with decreased need for sleep, no increased talkativeness, no racing thoughts, no impulsivity or risky behaviors, no increased spending, no increased libido, no grandiosity.  No hallucinations.  No increased irritability.  Denies dizziness, syncope, seizures, numbness, tingling, tremor, tics, unsteady gait, slurred speech, confusion. Denies muscle or joint pain, stiffness, or dystonia.  Individual Medical History/ Review of Systems: Changes? :No    Past medications for mental health diagnoses include: Xanax, Seroquel, BuSpar caused severe headache  Allergies: Chocolate  Current Medications:  Current Outpatient Medications:    ferrous sulfate 325 (65 FE) MG tablet, Take 325 mg by mouth daily with breakfast., Disp: , Rfl:    ibuprofen (ADVIL,MOTRIN) 600 MG tablet, Take 1 tablet (600 mg total) by mouth every 6 (six) hours as needed., Disp: 30 tablet, Rfl: 0   LORazepam (ATIVAN) 1 MG tablet, TAKE 1 TABLET BY MOUTH TWICE DAILY AS NEEDED FOR ANXIETY; MAY TAKE  OCCASIONAL EXTRA 1/2 TO 1 TABLET DURING THE DAY IF NEEDED, Disp: 75 tablet, Rfl: 1   QUEtiapine Fumarate (SEROQUEL XR) 150 MG 24 hr tablet, Take 1 tablet (150 mg total) by mouth at bedtime., Disp: 90 tablet, Rfl: 0   acetaminophen (TYLENOL) 500 MG tablet, Take 1,000 mg by mouth daily as needed for moderate pain or headache. (Patient not taking: Reported on 08/12/2019), Disp: , Rfl:    misoprostol (CYTOTEC) 200 MCG tablet, Take 3 tablets (600 mcg total) by mouth once for 1 dose., Disp: 3 tablet, Rfl: 0   traMADol (ULTRAM) 50 MG tablet, Take 1 tablet (50 mg total) by mouth every 6 (six) hours as needed. (Patient not taking: Reported on 02/21/2018), Disp: 6 tablet, Rfl: 0   zolpidem (AMBIEN) 10 MG tablet, Take 1 tablet (10 mg total) by mouth at bedtime as needed for sleep., Disp: 30 tablet, Rfl: 0 Medication Side Effects: none  Family Medical/ Social History: Changes? No  MENTAL HEALTH EXAM:  There were no vitals taken for this visit.There is no height or weight on file to calculate BMI.  General Appearance: Casual, Neat and Well Groomed  Eye Contact:  Good  Speech:  Clear and Coherent and Normal Rate  Volume:  Normal  Mood:  Depressed  Affect:  Depressed and Tearful  Thought Process:  Goal Directed and Descriptions of Associations: Intact  Orientation:  Full (Time, Place, and Person)  Thought Content: Logical   Suicidal Thoughts:  No  Homicidal Thoughts:  No  Memory:  WNL  Judgement:  Good  Insight:  Good  Psychomotor Activity:  Normal  Concentration:  Concentration: Good and Attention Span: Good  Recall:  Good  Fund of Knowledge: Good  Language: Good  Assets:  Desire for Improvement  ADL's:  Intact  Cognition: WNL  Prognosis:  Good    DIAGNOSES:    ICD-10-CM   1. Bipolar I disorder (HCC)  F31.9   2. Generalized anxiety disorder  F41.1   3. Insomnia due to other mental disorder  F51.05    F99   4. Encounter for long-term (current) use of medications  Z79.899 Lipid panel     Basic metabolic panel    Hemoglobin A1c    Receiving Psychotherapy: No    RECOMMENDATIONS:  PDMP reviewed. I provided 30 minutes of face-to-face time during this encounter. We discussed the sleep issue.  I prefer not to increase the Seroquel back because she was not able to tolerate it in the past few weeks.  She has taken Ambien in the past and that was helpful.  We will use that temporarily to get her sleep cycle back in order and then hopefully can stop that, and depend only on the Seroquel to help sleep, if needed.  Depending on how she responds with the Seroquel, we may need to increase that dose, if she tolerates it better. I reviewed her labs that were sent from her OB/GYN last month.  She did not have a hemoglobin A1c, BMP or lipid profile so I have ordered those. We discussed her taking some time off work.  She will be restarting counseling the end of this month and I think between that and medication changes, she will feel better.  But the time off work will be necessary to help that along. Start Ambien 10 mg 1/2- 1 qhs prn.  Cont Ativan 1 mg, 1 p.o. twice daily as needed with occasional extra half in middle of the day as needed. Cont Seroquel XR 150 mg, p.o. nightly Start counseling with  Uncertain name ?[facility]. Start leave on 08/13/2019-back 09/29/2019 Return in 4 weeks.   Melony Overly, PA-C

## 2019-08-19 ENCOUNTER — Telehealth: Payer: Self-pay | Admitting: Physician Assistant

## 2019-08-19 ENCOUNTER — Other Ambulatory Visit: Payer: Self-pay | Admitting: Physician Assistant

## 2019-08-19 DIAGNOSIS — Z79899 Other long term (current) drug therapy: Secondary | ICD-10-CM | POA: Diagnosis not present

## 2019-08-19 NOTE — Telephone Encounter (Signed)
Received disability papers from [employer]. Given to nurse. °

## 2019-08-20 LAB — BASIC METABOLIC PANEL
BUN/Creatinine Ratio: 9 (ref 9–23)
BUN: 7 mg/dL (ref 6–20)
CO2: 21 mmol/L (ref 20–29)
Calcium: 8.8 mg/dL (ref 8.7–10.2)
Chloride: 106 mmol/L (ref 96–106)
Creatinine, Ser: 0.82 mg/dL (ref 0.57–1.00)
GFR calc Af Amer: 109 mL/min/{1.73_m2} (ref >59)
GFR calc non Af Amer: 94 mL/min/{1.73_m2} (ref >59)
Glucose: 81 mg/dL (ref 65–99)
Potassium: 4.4 mmol/L (ref 3.5–5.2)
Sodium: 139 mmol/L (ref 134–144)

## 2019-08-20 LAB — LIPID PANEL
Chol/HDL Ratio: 2 ratio (ref 0.0–4.4)
Cholesterol, Total: 98 mg/dL — ABNORMAL LOW (ref 100–199)
HDL: 50 mg/dL (ref >39)
LDL Chol Calc (NIH): 35 mg/dL (ref 0–99)
Triglycerides: 58 mg/dL (ref 0–149)
VLDL Cholesterol Cal: 13 mg/dL (ref 5–40)

## 2019-08-20 LAB — HEMOGLOBIN A1C
Est. average glucose Bld gHb Est-mCnc: 114 mg/dL
Hgb A1c MFr Bld: 5.6 % (ref 4.8–5.6)

## 2019-08-21 ENCOUNTER — Encounter: Payer: Self-pay | Admitting: Physician Assistant

## 2019-08-21 ENCOUNTER — Telehealth: Payer: Self-pay | Admitting: Physician Assistant

## 2019-08-21 NOTE — Telephone Encounter (Signed)
Received disability papers from [employer]. Given to nurse.

## 2019-08-21 NOTE — Progress Notes (Signed)
Please let her know labs were completely normal. Kidney function, glucose, chol were all good.

## 2019-08-21 NOTE — Progress Notes (Signed)
No action needed.  This is a duplicate. BMP, hemoglobin A1c, fasting lipid panel were all normal

## 2019-09-09 ENCOUNTER — Ambulatory Visit (INDEPENDENT_AMBULATORY_CARE_PROVIDER_SITE_OTHER): Payer: BC Managed Care – PPO | Admitting: Physician Assistant

## 2019-09-09 ENCOUNTER — Other Ambulatory Visit: Payer: Self-pay

## 2019-09-09 ENCOUNTER — Encounter: Payer: Self-pay | Admitting: Physician Assistant

## 2019-09-09 DIAGNOSIS — F5105 Insomnia due to other mental disorder: Secondary | ICD-10-CM | POA: Diagnosis not present

## 2019-09-09 DIAGNOSIS — F411 Generalized anxiety disorder: Secondary | ICD-10-CM | POA: Diagnosis not present

## 2019-09-09 DIAGNOSIS — F99 Mental disorder, not otherwise specified: Secondary | ICD-10-CM | POA: Diagnosis not present

## 2019-09-09 DIAGNOSIS — F319 Bipolar disorder, unspecified: Secondary | ICD-10-CM

## 2019-09-09 MED ORDER — LORAZEPAM 1 MG PO TABS: ORAL_TABLET | ORAL | 2 refills | Status: DC

## 2019-09-09 MED ORDER — ZOLPIDEM TARTRATE 10 MG PO TABS: 10.0000 mg | ORAL_TABLET | Freq: Every evening | ORAL | 2 refills | Status: DC | PRN

## 2019-09-09 NOTE — Progress Notes (Signed)
[facility]  Patient ID: Alison Gomez,  MRN: [id number]  PCP: Ileana Ladd, MD  Date of Evaluation: 09/09/2019 Time spent:20 minutes  Chief Complaint:  Chief Complaint    Follow-up      HISTORY/CURRENT STATUS: Not doing well.   Her back is hurting, not new. Chronic problem and is worse when stressed, and is now b/c her leave hasn't been approved yet. Sees a [facility].  Has appointment this afternoon.  Mentally, she is doing well.  Feels that the Seroquel and Ativan are really helping with the depression and anxiety.  She does still have anxiety, mostly generalized and not panic attacks now.  And the Ambien helps her sleep.  She is more able to enjoy things now but still has trouble at times.  Energy and motivation are fair to good depending on the day.  She is not isolating.  Denies suicidal or homicidal thoughts.  Patient denies increased energy with decreased need for sleep, no increased talkativeness, no racing thoughts, no impulsivity or risky behaviors, no increased spending, no increased libido, no grandiosity.  No hallucinations.  No increased irritability.  Denies dizziness, syncope, seizures, numbness, tingling, tremor, tics, unsteady gait, slurred speech, confusion. Has low back pain right now.  Individual Medical History/ Review of Systems: Changes? :No    Past medications for mental health diagnoses include: Xanax, Seroquel, BuSpar caused severe headache  Allergies: Chocolate  Current Medications:  Current Outpatient Medications:  .  ferrous sulfate 325 (65 FE) MG tablet, Take 325 mg by mouth daily with breakfast., Disp: , Rfl:  .  ibuprofen (ADVIL,MOTRIN) 600 MG tablet, Take 1 tablet (600 mg total) by mouth every 6 (six) hours as needed., Disp: 30 tablet, Rfl: 0 .  LORazepam (ATIVAN) 1 MG tablet, TAKE 1 TABLET BY MOUTH TWICE DAILY AS NEEDED FOR ANXIETY; MAY TAKE OCCASIONAL EXTRA 1/2 TO 1 TABLET DURING THE DAY IF NEEDED, Disp: 75 tablet, Rfl:  2 .  QUEtiapine Fumarate (SEROQUEL XR) 150 MG 24 hr tablet, Take 1 tablet (150 mg total) by mouth at bedtime., Disp: 90 tablet, Rfl: 0 .  zolpidem (AMBIEN) 10 MG tablet, Take 1 tablet (10 mg total) by mouth at bedtime as needed for sleep., Disp: 30 tablet, Rfl: 2 .  acetaminophen (TYLENOL) 500 MG tablet, Take 1,000 mg by mouth daily as needed for moderate pain or headache. (Patient not taking: Reported on 08/12/2019), Disp: , Rfl:  .  misoprostol (CYTOTEC) 200 MCG tablet, Take 3 tablets (600 mcg total) by mouth once for 1 dose., Disp: 3 tablet, Rfl: 0 .  traMADol (ULTRAM) 50 MG tablet, Take 1 tablet (50 mg total) by mouth every 6 (six) hours as needed. (Patient not taking: Reported on 02/21/2018), Disp: 6 tablet, Rfl: 0 Medication Side Effects: none  Family Medical/ Social History: Changes? No  MENTAL HEALTH EXAM:  There were no vitals taken for this visit.There is no height or weight on file to calculate BMI.  General Appearance: Casual, Neat, Well Groomed and Appears to be in pain.  She is holding her right side.  Eye Contact:  Good  Speech:  Clear and Coherent and Normal Rate  Volume:  Normal  Mood:  Depressed  Affect:  Appropriate  Thought Process:  Goal Directed and Descriptions of Associations: Intact  Orientation:  Full (Time, Place, and Person)  Thought Content: Logical   Suicidal Thoughts:  No  Homicidal Thoughts:  No  Memory:  WNL  Judgement:  Good  Insight:  Good  Psychomotor  Activity:  Normal except been a little slow because of the back pain.  Concentration:  Concentration: Good and Attention Span: Good  Recall:  Good  Fund of Knowledge: Good  Language: Good  Assets:  Desire for Improvement  ADL's:  Intact  Cognition: WNL  Prognosis:  Good   08/19/2019 Glucose 81, BUN/CR nl, Lipid panel nl, Hgb A1C 5.6  DIAGNOSES:    ICD-10-CM   1. Bipolar I disorder (HCC)  F31.9   2. Generalized anxiety disorder  F41.1   3. Insomnia due to other mental disorder  F51.05    F99      Receiving Psychotherapy: No    RECOMMENDATIONS:  PDMP reviewed. I provided 20 minutes of face-to-face time during this encounter. Normal lab results were discussed with her. Cont Ativan 1 mg, 1 p.o. twice daily as needed with occasional extra half in middle of the day as needed. Cont Seroquel XR 150 mg, p.o. nightly Continue Ambien 10 mg, 1/2-1 p.o. nightly as needed sleep. Start leave on 08/13/2019-back 09/29/2019 Return in 4 weeks.  Melony Overly, PA-C

## 2019-09-26 DIAGNOSIS — R05 Cough: Secondary | ICD-10-CM | POA: Diagnosis not present

## 2019-09-26 DIAGNOSIS — J029 Acute pharyngitis, unspecified: Secondary | ICD-10-CM | POA: Diagnosis not present

## 2019-09-29 ENCOUNTER — Ambulatory Visit: Payer: BC Managed Care – PPO | Admitting: Physician Assistant

## 2019-09-30 DIAGNOSIS — Z113 Encounter for screening for infections with a predominantly sexual mode of transmission: Secondary | ICD-10-CM | POA: Diagnosis not present

## 2019-09-30 DIAGNOSIS — N925 Other specified irregular menstruation: Secondary | ICD-10-CM | POA: Diagnosis not present

## 2019-09-30 DIAGNOSIS — B373 Candidiasis of vulva and vagina: Secondary | ICD-10-CM | POA: Diagnosis not present

## 2019-09-30 DIAGNOSIS — N898 Other specified noninflammatory disorders of vagina: Secondary | ICD-10-CM | POA: Diagnosis not present

## 2019-09-30 DIAGNOSIS — N760 Acute vaginitis: Secondary | ICD-10-CM | POA: Diagnosis not present

## 2019-10-15 ENCOUNTER — Other Ambulatory Visit: Payer: Self-pay

## 2019-10-15 ENCOUNTER — Encounter: Payer: Self-pay | Admitting: Physician Assistant

## 2019-10-15 ENCOUNTER — Ambulatory Visit (INDEPENDENT_AMBULATORY_CARE_PROVIDER_SITE_OTHER): Payer: BC Managed Care – PPO | Admitting: Physician Assistant

## 2019-10-15 DIAGNOSIS — F5105 Insomnia due to other mental disorder: Secondary | ICD-10-CM | POA: Diagnosis not present

## 2019-10-15 DIAGNOSIS — F411 Generalized anxiety disorder: Secondary | ICD-10-CM | POA: Diagnosis not present

## 2019-10-15 DIAGNOSIS — F99 Mental disorder, not otherwise specified: Secondary | ICD-10-CM

## 2019-10-15 DIAGNOSIS — F319 Bipolar disorder, unspecified: Secondary | ICD-10-CM

## 2019-10-15 MED ORDER — QUETIAPINE FUMARATE ER 150 MG PO TB24: 150.0000 mg | ORAL_TABLET | Freq: Every day | ORAL | 0 refills | Status: DC

## 2019-10-15 NOTE — Progress Notes (Signed)
[facility]  Patient ID: Alison Gomez,  MRN: [id number]  PCP: Ileana Ladd, MD  Date of Evaluation: 10/15/2019 Time spent:20 minutes  Chief Complaint:  Chief Complaint    Follow-up      HISTORY/CURRENT STATUS: For routine med check.  Back at work and doing well. Feels that the Seroquel and Ativan are really helping with the depression and anxiety. And the Ambien helps her sleep.  She is more able to enjoy things now.  Energy and motivation are good now. Is going back to the gym now with a friend and they're going hiking some.  Is really enjoying that.  She is not isolating. Denies suicidal or homicidal thoughts.  Patient denies increased energy with decreased need for sleep, no increased talkativeness, no racing thoughts, no impulsivity or risky behaviors, no increased spending, no increased libido, no grandiosity.  No paranoia. No hallucinations.  No increased irritability.  Denies dizziness, syncope, seizures, numbness, tingling, tremor, tics, unsteady gait, slurred speech, confusion. Has low back pain right now.  Individual Medical History/ Review of Systems: Changes? :No    Past medications for mental health diagnoses include: Xanax, Seroquel, BuSpar caused severe headache  Allergies: Chocolate  Current Medications:  Current Outpatient Medications:  .  ferrous sulfate 325 (65 FE) MG tablet, Take 325 mg by mouth daily with breakfast., Disp: , Rfl:  .  ibuprofen (ADVIL,MOTRIN) 600 MG tablet, Take 1 tablet (600 mg total) by mouth every 6 (six) hours as needed., Disp: 30 tablet, Rfl: 0 .  LORazepam (ATIVAN) 1 MG tablet, TAKE 1 TABLET BY MOUTH TWICE DAILY AS NEEDED FOR ANXIETY; MAY TAKE OCCASIONAL EXTRA 1/2 TO 1 TABLET DURING THE DAY IF NEEDED, Disp: 75 tablet, Rfl: 2 .  QUEtiapine Fumarate (SEROQUEL XR) 150 MG 24 hr tablet, Take 1 tablet (150 mg total) by mouth at bedtime., Disp: 90 tablet, Rfl: 0 .  acetaminophen (TYLENOL) 500 MG tablet, Take 1,000 mg by  mouth daily as needed for moderate pain or headache. (Patient not taking: Reported on 08/12/2019), Disp: , Rfl:  .  misoprostol (CYTOTEC) 200 MCG tablet, Take 3 tablets (600 mcg total) by mouth once for 1 dose., Disp: 3 tablet, Rfl: 0 .  traMADol (ULTRAM) 50 MG tablet, Take 1 tablet (50 mg total) by mouth every 6 (six) hours as needed. (Patient not taking: Reported on 02/21/2018), Disp: 6 tablet, Rfl: 0 .  zolpidem (AMBIEN) 10 MG tablet, Take 1 tablet (10 mg total) by mouth at bedtime as needed for sleep., Disp: 30 tablet, Rfl: 2 Medication Side Effects: none  Family Medical/ Social History: Changes? No  MENTAL HEALTH EXAM:  There were no vitals taken for this visit.There is no height or weight on file to calculate BMI.  General Appearance: Casual, Neat and Well Groomed  Eye Contact:  Good  Speech:  Clear and Coherent and Normal Rate  Volume:  Normal  Mood:  Euthymic  Affect:  Appropriate  Thought Process:  Goal Directed and Descriptions of Associations: Intact  Orientation:  Full (Time, Place, and Person)  Thought Content: Logical   Suicidal Thoughts:  No  Homicidal Thoughts:  No  Memory:  WNL  Judgement:  Good  Insight:  Good  Psychomotor Activity:  Normal  Concentration:  Concentration: Good and Attention Span: Good  Recall:  Good  Fund of Knowledge: Good  Language: Good  Assets:  Desire for Improvement  ADL's:  Intact  Cognition: WNL  Prognosis:  Good    DIAGNOSES:  ICD-10-CM   1. Bipolar I disorder (HCC)  F31.9   2. Generalized anxiety disorder  F41.1   3. Insomnia due to other mental disorder  F51.05    F99     Receiving Psychotherapy: No    RECOMMENDATIONS:  PDMP reviewed. I provided 20 minutes of face-to-face time during this encounter. I'm glad to see her doing so much better! Cont Ativan 1 mg, 1 p.o. twice daily as needed with occasional extra half in middle of the day as needed. Cont Seroquel XR 150 mg, p.o. nightly Continue Ambien 10 mg, 1/2-1 p.o.  nightly as needed sleep. Return in 3 months.  Melony Overly, PA-C

## 2019-11-20 DIAGNOSIS — L293 Anogenital pruritus, unspecified: Secondary | ICD-10-CM | POA: Diagnosis not present

## 2019-11-20 DIAGNOSIS — L739 Follicular disorder, unspecified: Secondary | ICD-10-CM | POA: Diagnosis not present

## 2020-01-16 ENCOUNTER — Ambulatory Visit: Payer: BC Managed Care – PPO | Admitting: Physician Assistant

## 2020-01-30 ENCOUNTER — Other Ambulatory Visit: Payer: Self-pay | Admitting: Physician Assistant

## 2020-03-19 DIAGNOSIS — Z130 Encounter for screening for diseases of the blood and blood-forming organs and certain disorders involving the immune mechanism: Secondary | ICD-10-CM | POA: Diagnosis not present

## 2020-03-19 DIAGNOSIS — Z1389 Encounter for screening for other disorder: Secondary | ICD-10-CM | POA: Diagnosis not present

## 2020-03-19 DIAGNOSIS — N898 Other specified noninflammatory disorders of vagina: Secondary | ICD-10-CM | POA: Diagnosis not present

## 2020-03-19 DIAGNOSIS — Z113 Encounter for screening for infections with a predominantly sexual mode of transmission: Secondary | ICD-10-CM | POA: Diagnosis not present

## 2020-03-19 DIAGNOSIS — Z6828 Body mass index (BMI) 28.0-28.9, adult: Secondary | ICD-10-CM | POA: Diagnosis not present

## 2020-03-19 DIAGNOSIS — Z01419 Encounter for gynecological examination (general) (routine) without abnormal findings: Secondary | ICD-10-CM | POA: Diagnosis not present

## 2020-04-08 IMAGING — US US OB TRANSVAGINAL
1 of 2 series · 13 of 28 positions shown · non-contrast
Comparison: None.

CLINICAL DATA: Pelvic pain for 1 day, vaginal bleeding. Beta HCG
1899.

EXAM:
OBSTETRIC <14 WK US AND TRANSVAGINAL OB US
TECHNIQUE: Both transabdominal and transvaginal ultrasound examinations were
performed for complete evaluation of the gestation as well as the
maternal uterus, adnexal regions, and pelvic cul-de-sac.
Transvaginal technique was performed to assess early pregnancy.

[Series 1: us ob transvaginal · 55 acquisitions, 13 frames shown]
[im 3/55]
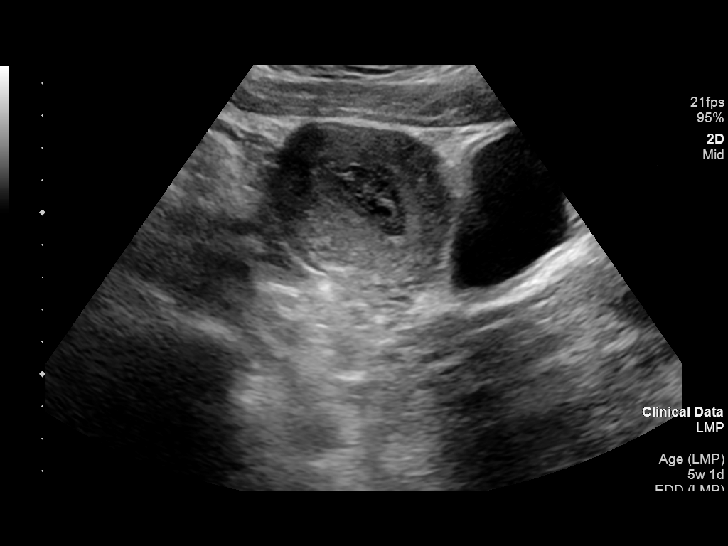
[im 7/55]
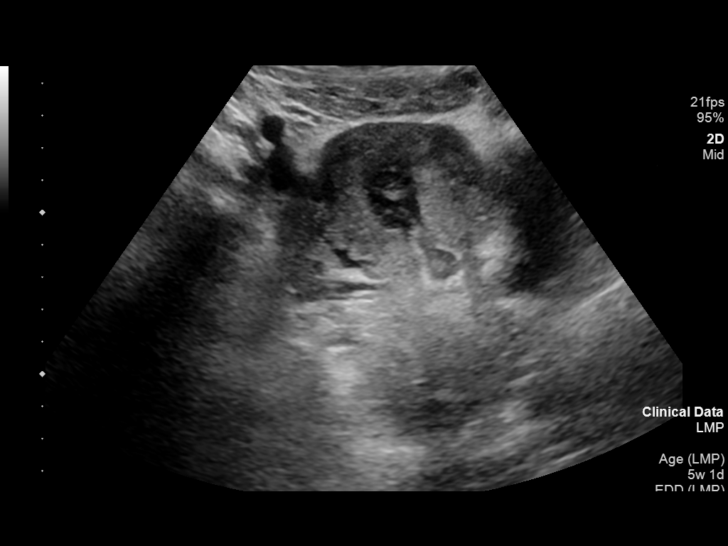
[im 11/55]
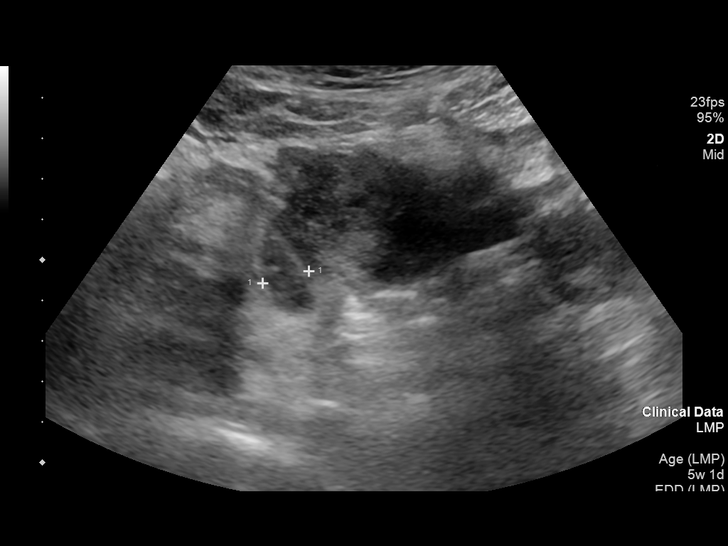
[im 15/55]
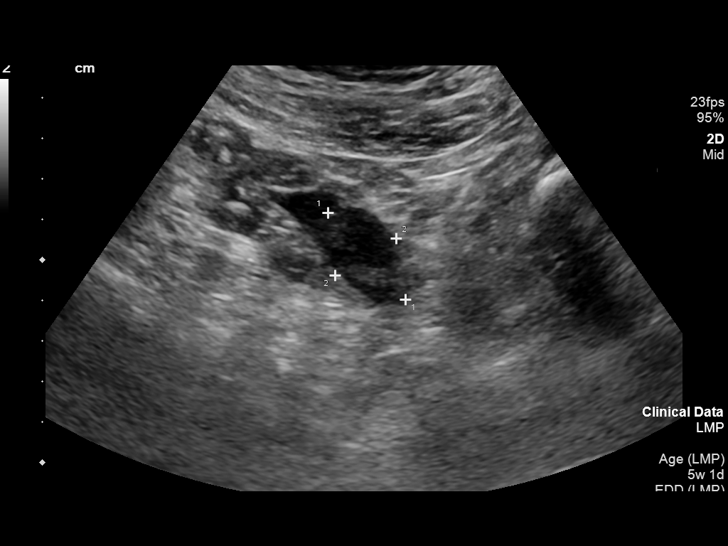
[im 19/55]
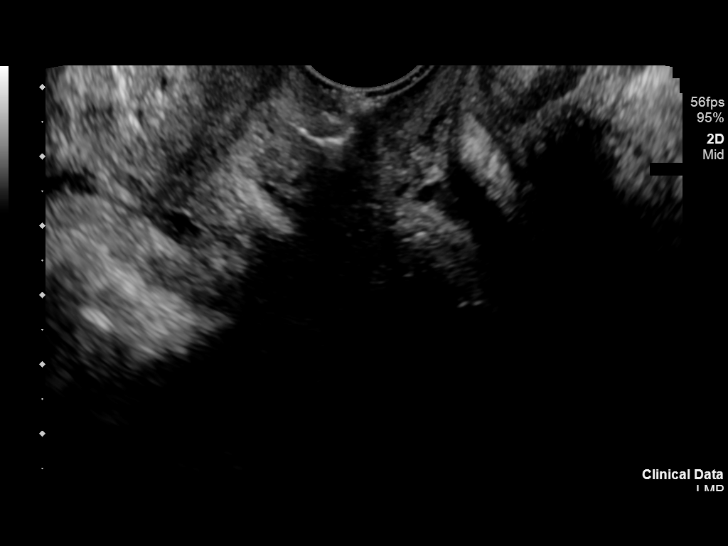
[im 23/55]
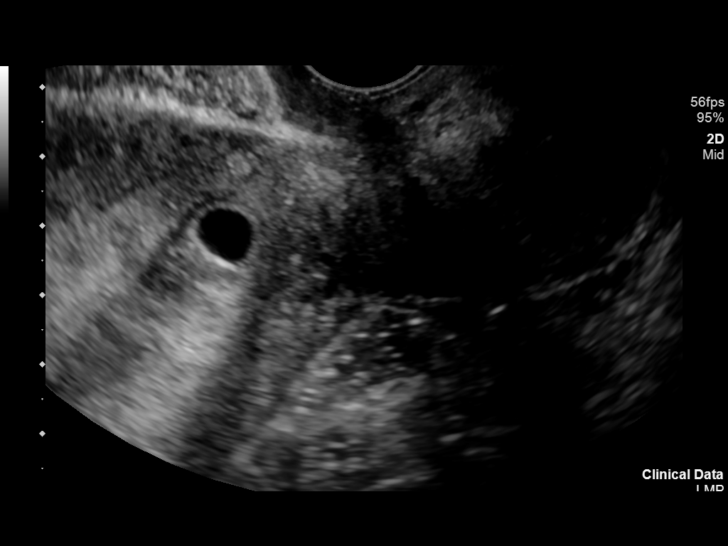
[im 30/55]
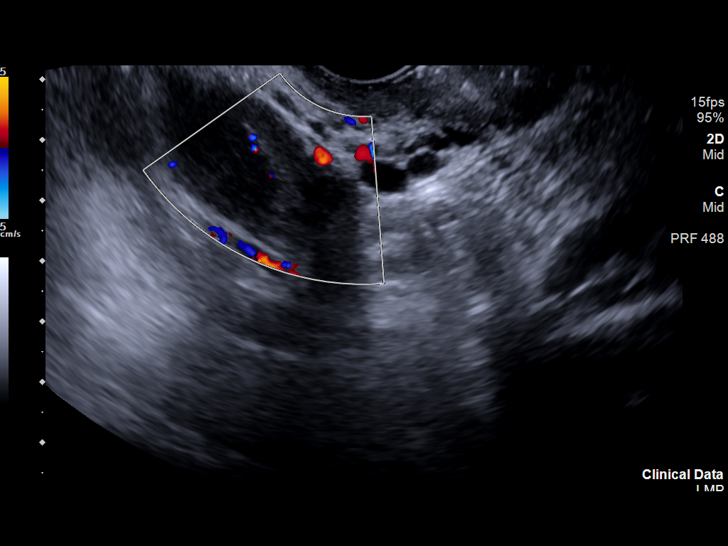
[im 34/55]
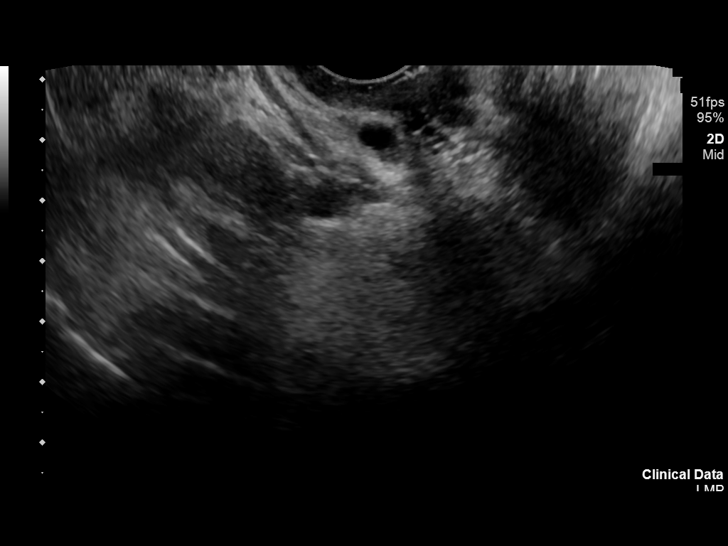
[im 38/55]
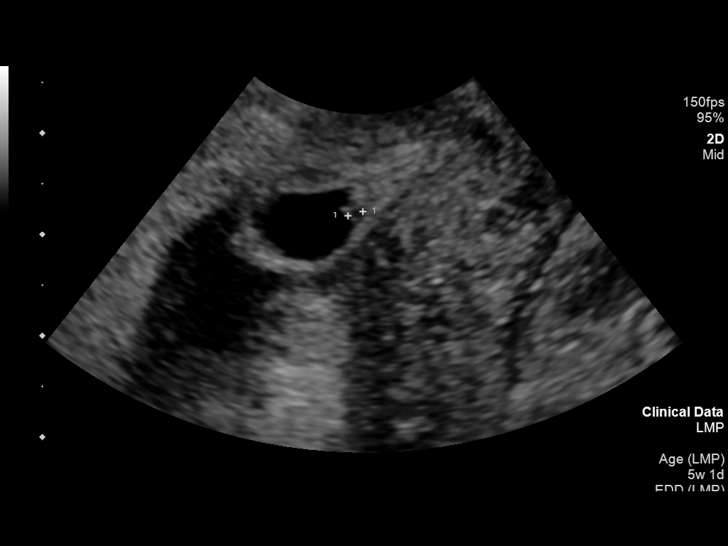
[im 42/55]
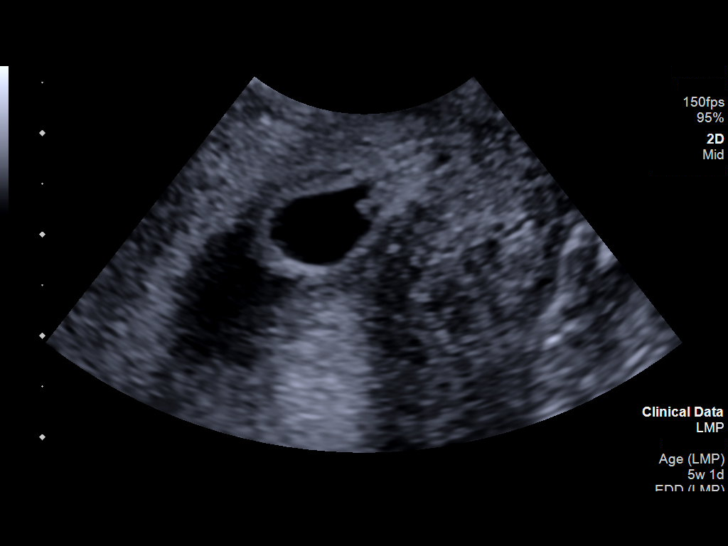
[im 46/55]
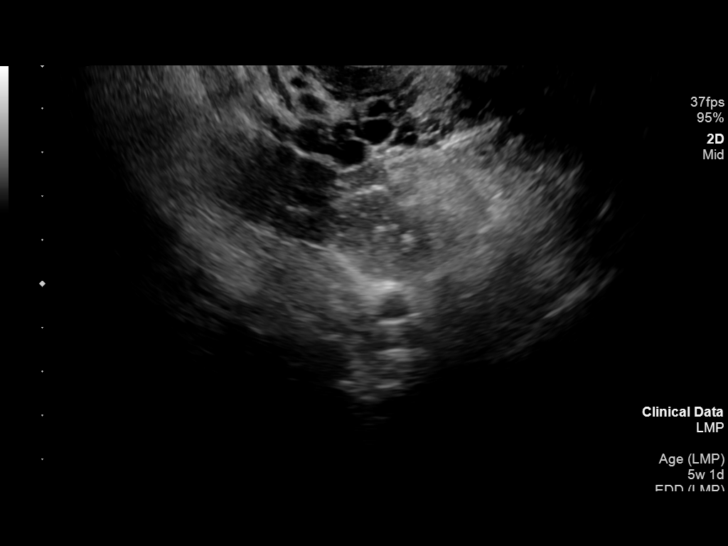
[im 50/55]
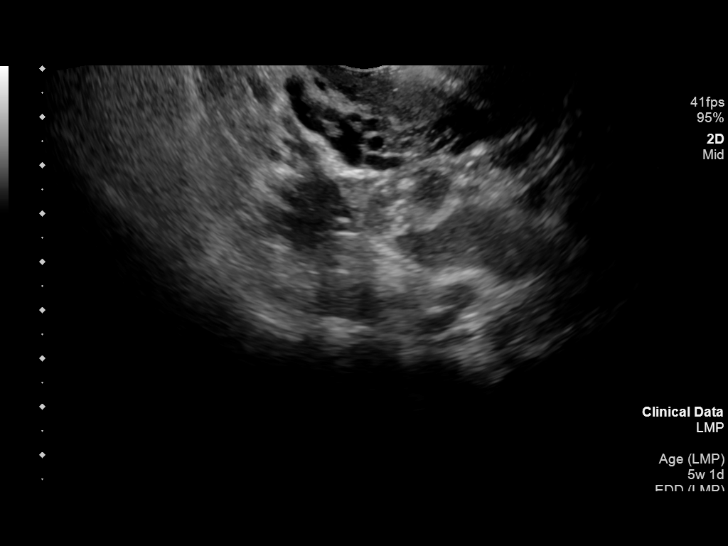
[im 55/55]
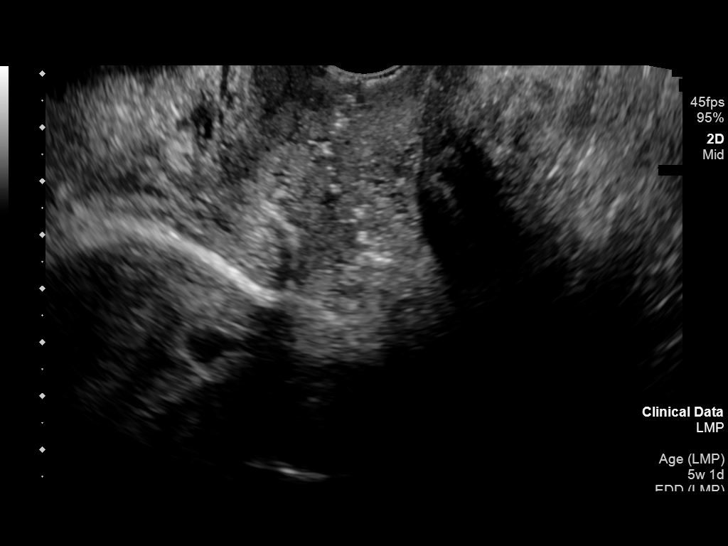

[13 of 28 positions shown; findings below may reference images not displayed]

FINDINGS: Intrauterine gestational sac: Present though located in the lower
uterine segment with teardrop morphology.

Yolk sac: Present though somewhat linear in appearance, potentially
reflecting embryo.

Cardiac Activity: At applicable.

MSD: 11 mm   5 w   6 d

Subchorionic hemorrhage:  Moderate.

Maternal uterus/adnexae: Normal appearance of the adnexa. 2.2 cm
LEFT corpus luteal cyst.
IMPRESSION: Probable early intrauterine gestational sac with abnormal morphology
and, within lower uterine segment. Possible yolk sac versus fetal
pole. Moderate subchorionic hemorrhage. Recommend follow-up
quantitative B-HCG levels and follow-up US in 14 days to confirm and
assess viability. This recommendation follows SRU consensus
guidelines: Diagnostic Criteria for Nonviable Pregnancy Early in the
First Trimester. N Engl J Med 9434; [DATE].

## 2020-04-13 ENCOUNTER — Other Ambulatory Visit: Payer: Self-pay | Admitting: Physician Assistant

## 2020-04-13 ENCOUNTER — Encounter: Payer: Self-pay | Admitting: Physician Assistant

## 2020-04-13 ENCOUNTER — Other Ambulatory Visit: Payer: Self-pay

## 2020-04-13 ENCOUNTER — Ambulatory Visit (INDEPENDENT_AMBULATORY_CARE_PROVIDER_SITE_OTHER): Payer: BC Managed Care – PPO | Admitting: Physician Assistant

## 2020-04-13 DIAGNOSIS — F319 Bipolar disorder, unspecified: Secondary | ICD-10-CM

## 2020-04-13 DIAGNOSIS — F5105 Insomnia due to other mental disorder: Secondary | ICD-10-CM

## 2020-04-13 DIAGNOSIS — F99 Mental disorder, not otherwise specified: Secondary | ICD-10-CM | POA: Diagnosis not present

## 2020-04-13 DIAGNOSIS — F411 Generalized anxiety disorder: Secondary | ICD-10-CM

## 2020-04-13 MED ORDER — QUETIAPINE FUMARATE ER 150 MG PO TB24: ORAL_TABLET | ORAL | 1 refills | Status: DC

## 2020-04-13 MED ORDER — ZOLPIDEM TARTRATE 10 MG PO TABS: 10.0000 mg | ORAL_TABLET | Freq: Every evening | ORAL | 5 refills | Status: DC | PRN

## 2020-04-13 MED ORDER — LORAZEPAM 1 MG PO TABS: ORAL_TABLET | ORAL | 5 refills | Status: DC

## 2020-04-13 NOTE — Progress Notes (Signed)
[facility]  Patient ID: Alison Gomez,  MRN: [id number]  PCP: Ileana Ladd, MD  Date of Evaluation: 04/13/2020 Time spent:20 minutes  Chief Complaint:  Chief Complaint    Anxiety; Depression; Insomnia; Follow-up      HISTORY/CURRENT STATUS: HPI For routine med check.  Since LOV,she's been doing well. Able to enjoy things, especially traveling. Work is going well. Denies decreased energy or motivation.  Appetite has not changed.  No extreme sadness, tearfulness, or feelings of hopelessness.  Denies any changes in concentration, making decisions or remembering things.  Denies suicidal or homicidal thoughts.  Anxiety is still a problem, but not nearly as bad as in the past. More generalized than PA now. Sense of unease at times. Ativan helps. Sleeps good with the Ambien.   Patient denies increased energy with decreased need for sleep, no increased talkativeness, no racing thoughts, no impulsivity or risky behaviors, no increased spending, no increased libido, no grandiosity, no increased irritability or anger, and no hallucinations.  Denies dizziness, syncope, seizures, numbness, tingling, tremor, tics, unsteady gait, slurred speech, confusion. Denies muscle or joint pain, stiffness, or dystonia. Denies unexplained weight loss, frequent infections, or sores that heal slowly.  No polyphagia, polydipsia, or polyuria. Denies visual changes or paresthesias.   Individual Medical History/ Review of Systems: Changes? :Yes  Had COVID since LOV, wasn't too severe.   Past medications for mental health diagnoses include: Xanax, Seroquel, BuSpar caused severe headache  Allergies: Chocolate  Current Medications:  Current Outpatient Medications:  .  ferrous sulfate 325 (65 FE) MG tablet, Take 325 mg by mouth daily with breakfast., Disp: , Rfl:  .  ibuprofen (ADVIL,MOTRIN) 600 MG tablet, Take 1 tablet (600 mg total) by mouth every 6 (six) hours as needed., Disp: 30 tablet, Rfl:  0 .  norelgestromin-ethinyl estradiol [employer]) 150-35 MCG/24HR transdermal patch, Xulane 150 mcg-35 mcg/24 hr transdermal patch, Disp: , Rfl:  .  acetaminophen (TYLENOL) 500 MG tablet, Take 1,000 mg by mouth daily as needed for moderate pain or headache. (Patient not taking: No sig reported), Disp: , Rfl:  .  LORazepam (ATIVAN) 1 MG tablet, TAKE 1 TABLET BY MOUTH TWICE DAILY AS NEEDED FOR ANXIETY; MAY TAKE OCCASIONAL EXTRA 1/2 TO 1 TABLET DURING THE DAY IF NEEDED, Disp: 75 tablet, Rfl: 5 .  misoprostol (CYTOTEC) 200 MCG tablet, Take 3 tablets (600 mcg total) by mouth once for 1 dose., Disp: 3 tablet, Rfl: 0 .  QUEtiapine Fumarate (SEROQUEL XR) 150 MG 24 hr tablet, TAKE 1 TABLET(150 MG) BY MOUTH AT BEDTIME, Disp: 90 tablet, Rfl: 1 .  traMADol (ULTRAM) 50 MG tablet, Take 1 tablet (50 mg total) by mouth every 6 (six) hours as needed. (Patient not taking: No sig reported), Disp: 6 tablet, Rfl: 0 .  zolpidem (AMBIEN) 10 MG tablet, Take 1 tablet (10 mg total) by mouth at bedtime as needed for sleep., Disp: 30 tablet, Rfl: 5 Medication Side Effects: none  Family Medical/ Social History: Changes? No  MENTAL HEALTH EXAM:  There were no vitals taken for this visit.There is no height or weight on file to calculate BMI.  General Appearance: Casual and Well Groomed  Eye Contact:  Good  Speech:  Clear and Coherent and Normal Rate  Volume:  Normal  Mood:  Euthymic  Affect:  Appropriate  Thought Process:  Goal Directed and Descriptions of Associations: Intact  Orientation:  Full (Time, Place, and Person)  Thought Content: Logical   Suicidal Thoughts:  No  Homicidal Thoughts:  No  Memory:  WNL  Judgement:  Good  Insight:  Good  Psychomotor Activity:  Normal  Concentration:  Concentration: Good  Recall:  Good  Fund of Knowledge: Good  Language: Good  Assets:  Desire for Improvement  ADL's:  Intact  Cognition: WNL  Prognosis:  Good   DIAGNOSES:    ICD-10-CM   1. Bipolar I disorder (HCC)  F31.9    2. Generalized anxiety disorder  F41.1   3. Insomnia due to other mental disorder  F51.05    F99     Receiving Psychotherapy: No    RECOMMENDATIONS:  PDMP reviewed.  I provided 20 minutes of face to face time during this encounter, including time spent before and after the visit in chart review and charting.  She's doing well so no changes are necessary.  Continue Ativan 1 mg, 1 po bid prn, w/ occas 1/2 during day prn. Continue Seroquel XR 150 mg, 1 po qhs. Continue Ambien 10 mg, 1 po qhs prn. Order labs at next OV. Return in 6 months.  Melony Overly, PA-C

## 2020-04-13 NOTE — Telephone Encounter (Signed)
Controlled substance 

## 2020-04-17 ENCOUNTER — Encounter: Payer: Self-pay | Admitting: Physician Assistant

## 2020-04-19 ENCOUNTER — Telehealth: Payer: Self-pay | Admitting: Physician Assistant

## 2020-04-19 NOTE — Telephone Encounter (Signed)
Please review

## 2020-04-19 NOTE — Telephone Encounter (Signed)
Next visit is 10/15/20. Alison Gomez called stating that on 04/16/20 she had a severe panic attack at her employer and almost passed out. She wants to know if she can get a letter allowing her to work from home. Letter needs addressed to [employer], Attn: Museum/gallery exhibitions officer.She will pick up when ready. Her phone number is [phone].

## 2020-04-21 ENCOUNTER — Telehealth: Payer: Self-pay | Admitting: Physician Assistant

## 2020-04-21 NOTE — Telephone Encounter (Signed)
Addendum to 04/13/20 message. Ayala needs letter faxed to employer at [phone].

## 2020-04-21 NOTE — Telephone Encounter (Signed)
I think you may have done this already?

## 2020-04-21 NOTE — Telephone Encounter (Signed)
I just dictated the letter but forgot to add her phone #. Please call her when ready. Thanks

## 2020-04-30 ENCOUNTER — Telehealth: Payer: Self-pay | Admitting: Physician Assistant

## 2020-04-30 NOTE — Telephone Encounter (Signed)
Received work Doctor, general practice form from [employer] for completion. Placed on Traci's desk. Fiza dropped this form by. Her employer got the letter but they need this now. Her phone number is [phone].

## 2020-05-05 DIAGNOSIS — Z0289 Encounter for other administrative examinations: Secondary | ICD-10-CM

## 2020-05-06 ENCOUNTER — Telehealth: Payer: Self-pay

## 2020-05-06 NOTE — Telephone Encounter (Signed)
Received phone call from [employer] with 3 questions concerning her requested accomodation to work from home.  1. What is preventing or triggering her to not be able to come to the office?   2. Rosey Bath wrote 6 months duration, they are asking if extra breaks are needed? Could she work hybrid: such as work 2 days then off 3 days in a week. Is that possible?   3. Asking about medication that would effect her in anyway? I answered this already with she's on a medication for anxiety as needed but it shouldn't be sedating for her during the day if taken.  Feel free to add anything else concerning her medication.    Informed her I would touch base with Rosey Bath to discuss then call her back. [phone]

## 2020-05-06 NOTE — Telephone Encounter (Signed)
1-being around a lot of people/noise is a trigger for panic attacks  2-we can try hybrid, 2 days in office, 3 working from home. Can re-assess in 2 months. Traci, please have her make appt.  3-agree and nothing to add about medication side effects.

## 2020-05-10 NOTE — Telephone Encounter (Signed)
Pt has been notified of suggestion, she agrees to give it a try at least. Faxed information to [employer]. Will f/u tomorrow with [employer].

## 2020-05-21 DIAGNOSIS — L309 Dermatitis, unspecified: Secondary | ICD-10-CM | POA: Diagnosis not present

## 2020-06-26 DIAGNOSIS — R059 Cough, unspecified: Secondary | ICD-10-CM | POA: Diagnosis not present

## 2020-06-26 DIAGNOSIS — Z03818 Encounter for observation for suspected exposure to other biological agents ruled out: Secondary | ICD-10-CM | POA: Diagnosis not present

## 2020-06-26 DIAGNOSIS — J029 Acute pharyngitis, unspecified: Secondary | ICD-10-CM | POA: Diagnosis not present

## 2020-06-26 DIAGNOSIS — J069 Acute upper respiratory infection, unspecified: Secondary | ICD-10-CM | POA: Diagnosis not present

## 2020-06-29 ENCOUNTER — Telehealth: Payer: Self-pay | Admitting: Physician Assistant

## 2020-06-29 NOTE — Telephone Encounter (Signed)
Received Health Care Provider Certification from [employer]. Placed on Traci's desk for completion.

## 2020-10-06 ENCOUNTER — Telehealth: Payer: Self-pay | Admitting: Physician Assistant

## 2020-10-06 NOTE — Telephone Encounter (Signed)
I will grab her paperwork, asking for 6 days/month instead of the 2 days/month we submitted.

## 2020-10-06 NOTE — Telephone Encounter (Signed)
Patient called in concerning her FMLA paperwork. Patient states that FMLA was supposed to be up to six days a month and with recent FMLA changes has only two days a month and job is giving her trouble when she needs to leave or be off from work. She would like this to be fixed so she doesn't continue to get into trouble. Pls rtc [phone]

## 2020-10-11 NOTE — Telephone Encounter (Signed)
It's officially printed and in your box.

## 2020-10-11 NOTE — Telephone Encounter (Signed)
This has been faxed with her update.

## 2020-10-11 NOTE — Telephone Encounter (Signed)
Signed and back to Traci.

## 2020-10-15 ENCOUNTER — Other Ambulatory Visit: Payer: Self-pay

## 2020-10-15 ENCOUNTER — Ambulatory Visit: Payer: BC Managed Care – PPO | Admitting: Physician Assistant

## 2020-10-15 ENCOUNTER — Encounter: Payer: Self-pay | Admitting: Physician Assistant

## 2020-10-15 DIAGNOSIS — F99 Mental disorder, not otherwise specified: Secondary | ICD-10-CM

## 2020-10-15 DIAGNOSIS — F411 Generalized anxiety disorder: Secondary | ICD-10-CM | POA: Diagnosis not present

## 2020-10-15 DIAGNOSIS — F319 Bipolar disorder, unspecified: Secondary | ICD-10-CM

## 2020-10-15 DIAGNOSIS — F5105 Insomnia due to other mental disorder: Secondary | ICD-10-CM

## 2020-10-15 MED ORDER — QUETIAPINE FUMARATE ER 150 MG PO TB24: ORAL_TABLET | ORAL | 3 refills | Status: DC

## 2020-10-15 MED ORDER — LORAZEPAM 1 MG PO TABS: ORAL_TABLET | ORAL | 5 refills | Status: DC

## 2020-10-15 MED ORDER — ZOLPIDEM TARTRATE 10 MG PO TABS: 10.0000 mg | ORAL_TABLET | Freq: Every evening | ORAL | 5 refills | Status: DC | PRN

## 2020-10-15 NOTE — Progress Notes (Signed)
[facility]  Patient ID: Alison Gomez,  MRN: [id number]  PCP: Ileana Ladd, MD  Date of Evaluation: 10/15/2020 Time spent:20 minutes  Chief Complaint:  Chief Complaint   Anxiety; Depression; Insomnia; Follow-up     HISTORY/CURRENT STATUS: HPI For routine med check.  More anxious lately. Has been out of her medicine for about 2 weeks, said 'they wouldn't give me a RF.' Talking about seroquel. Says she has the Ambien and Ativan.  Ativan is still very effective.  Has missed more work b/c of anxiety/depression, during past 2 months, she missed 2 hours of work on 3 different days and they count that as a whole day.  Her FMLA papers were re-done so that she will have up to 6/month if needed.  Feels like the medications are working well.  Before she ran out of the Seroquel a couple of weeks ago, states she had been doing pretty well except for some personal things going on.  Her kids father has been having some mental health issues himself and that has been hard for her because of their kids.  She has been able to enjoy things though, energy and motivation have been normal.  Personal hygiene is normal.  ADLs normal.  Sleeps well most of the time.  Does get anxious but the lorazepam is effective.  Ambien is also effective.  No suicidal or homicidal thoughts.  Patient denies increased energy with decreased need for sleep, no increased talkativeness, no racing thoughts, no impulsivity or risky behaviors, no increased spending, no increased libido, no grandiosity, no increased irritability or anger, and no hallucinations.  Denies dizziness, syncope, seizures, numbness, tingling, tremor, tics, unsteady gait, slurred speech, confusion. Denies muscle or joint pain, stiffness, or dystonia. Denies unexplained weight loss, frequent infections, or sores that heal slowly.  No polyphagia, polydipsia, or polyuria. Denies visual changes or paresthesias.   Individual Medical History/ Review  of Systems: Changes? :No    Past medications for mental health diagnoses include: Xanax, Seroquel, BuSpar caused severe headache  Allergies: Chocolate  Current Medications:  Current Outpatient Medications:    norelgestromin-ethinyl estradiol [employer]) 150-35 MCG/24HR transdermal patch, Xulane 150 mcg-35 mcg/24 hr transdermal patch, Disp: , Rfl:    acetaminophen (TYLENOL) 500 MG tablet, Take 1,000 mg by mouth daily as needed for moderate pain or headache. (Patient not taking: No sig reported), Disp: , Rfl:    ferrous sulfate 325 (65 FE) MG tablet, Take 325 mg by mouth daily with breakfast. (Patient not taking: Reported on 10/15/2020), Disp: , Rfl:    ibuprofen (ADVIL,MOTRIN) 600 MG tablet, Take 1 tablet (600 mg total) by mouth every 6 (six) hours as needed. (Patient not taking: Reported on 10/15/2020), Disp: 30 tablet, Rfl: 0   LORazepam (ATIVAN) 1 MG tablet, TAKE 1 TABLET BY MOUTH TWICE DAILY AS NEEDED FOR ANXIETY; MAY TAKE OCCASIONAL EXTRA 1/2 TO 1 TABLET DURING THE DAY IF NEEDED, Disp: 75 tablet, Rfl: 5   misoprostol (CYTOTEC) 200 MCG tablet, Take 3 tablets (600 mcg total) by mouth once for 1 dose., Disp: 3 tablet, Rfl: 0   QUEtiapine Fumarate (SEROQUEL XR) 150 MG 24 hr tablet, TAKE 1 TABLET(150 MG) BY MOUTH AT BEDTIME, Disp: 90 tablet, Rfl: 3   traMADol (ULTRAM) 50 MG tablet, Take 1 tablet (50 mg total) by mouth every 6 (six) hours as needed. (Patient not taking: No sig reported), Disp: 6 tablet, Rfl: 0   zolpidem (AMBIEN) 10 MG tablet, Take 1 tablet (10 mg total) by mouth at  bedtime as needed for sleep., Disp: 30 tablet, Rfl: 5 Medication Side Effects: none  Family Medical/ Social History: Changes? No  MENTAL HEALTH EXAM:  There were no vitals taken for this visit.There is no height or weight on file to calculate BMI.  General Appearance: Casual and Well Groomed  Eye Contact:  Good  Speech:  Clear and Coherent and Normal Rate  Volume:  Normal  Mood:  Euthymic  Affect:  Appropriate   Thought Process:  Goal Directed and Descriptions of Associations: Intact  Orientation:  Full (Time, Place, and Person)  Thought Content: Logical   Suicidal Thoughts:  No  Homicidal Thoughts:  No  Memory:  WNL  Judgement:  Good  Insight:  Good  Psychomotor Activity:  Normal  Concentration:  Concentration: Good  Recall:  Good  Fund of Knowledge: Good  Language: Good  Assets:  Desire for Improvement  ADL's:  Intact  Cognition: WNL  Prognosis:  Good   DIAGNOSES:    ICD-10-CM   1. Generalized anxiety disorder  F41.1     2. Bipolar I disorder (HCC)  F31.9     3. Insomnia due to other mental disorder  F51.05    F99        Receiving Psychotherapy: No    RECOMMENDATIONS:  PDMP reviewed.  Last Ambien filled 9/14, lorazepam filled 9/14. I provided 20 minutes of face to face time during this encounter, including time spent before and after the visit in records review, medical decision making, and charting.  Overall she is doing well so no changes in medications need to be made.  Continue Ativan 1 mg, 1 po bid prn, w/ occas 1/2 during day prn. Continue Seroquel XR 150 mg, 1 po qhs. Continue Ambien 10 mg, 1 po qhs prn. FMLA for 6 days/month intermittent. Order labs at next OV. Return in 6 months.  Melony Overly, PA-C

## 2020-10-29 ENCOUNTER — Telehealth: Payer: Self-pay | Admitting: Physician Assistant

## 2020-10-29 NOTE — Telephone Encounter (Signed)
Yes it is okay to keep her out, due to anxiety is first diagnosis.  At her visit on 10/15/2020 she was somewhat depressed as well, mostly because she had run out of the Seroquel.  Have her return 12/20/2020.  I will see her on 12/17/2020 and make sure she is ready.  Thank you.

## 2020-10-29 NOTE — Telephone Encounter (Signed)
Rtc to pt and she reports feeling like she needs to take a break to gather her thoughts. She had a death in her family, the funeral is Saturday in [place]. She reports with everything going on she feels like she can't do her best and focus. 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 She has only mentioned this to her manager and not contacted HR until she could discuss with Rosey Bath first. We discussed a time frame, she was thinking maybe 4-6 weeks. Her next apt is in December, maybe reassess her then if appropriate for her to take off. Informed her I would discuss with Rosey Bath and call her back.

## 2020-10-29 NOTE — Telephone Encounter (Signed)
Pt is aware and will have HR sent paperwork over to be completed.

## 2020-10-29 NOTE — Telephone Encounter (Signed)
Alison Gomez called and last time she saw Alison Gomez she talked about possibly taking a break from her job. Her aunt recently died and she needs to take a break. Could someone please call her at [phone] and let her know what the steps she needs to take to do this.

## 2020-11-02 ENCOUNTER — Telehealth: Payer: Self-pay | Admitting: Physician Assistant

## 2020-11-02 NOTE — Telephone Encounter (Signed)
Received fax from [facility] regarding Alison Gomez. Completion needed on FMLA form. Placed in Traci's box

## 2020-11-02 NOTE — Telephone Encounter (Signed)
Noted thank you

## 2020-11-10 ENCOUNTER — Telehealth: Payer: Self-pay | Admitting: Physician Assistant

## 2020-11-10 NOTE — Telephone Encounter (Signed)
I can't remember if I mentioned Burney Gauze, but I recommend her.  Danae Orleans, or [employer]. Also call [facility], I don't have a specific person in mind there though.

## 2020-11-10 NOTE — Telephone Encounter (Signed)
Alison Gomez, I will free up your spot I blocked earlier.    Alison Gomez, is there anything else I can tell Alison Gomez?

## 2020-11-10 NOTE — Telephone Encounter (Signed)
Alison Gomez, Alison Gomez has tried to find a therapist using the info you gave to her earlier this week (I don't see the info EPIC, but it was [facility], and 2 ladies by name and somewhere else).  She said the 2 ladies you gave her, didn't work at that practice anymore.  Someone else told her it would be next year and someone else said Dec.  She would rather stick with someone within this practice since she is already seeing you.  As of this moment, the only availability for therapists I see is for Debbie on 12/5.  I told her I would have to ask you if she would refer her to [place] AND if Eunice Blase is willing to see her.  I have the appt blocked so it won't be taken.  Pls let me know asap so I can free up the spot if this will not work.

## 2020-11-11 NOTE — Telephone Encounter (Signed)
I called her back and gave her the info.  She said those were names you had given her before, except for [place].  She is going to try her.

## 2020-11-16 DIAGNOSIS — F32A Depression, unspecified: Secondary | ICD-10-CM | POA: Diagnosis not present

## 2020-11-16 DIAGNOSIS — F410 Panic disorder [episodic paroxysmal anxiety] without agoraphobia: Secondary | ICD-10-CM | POA: Diagnosis not present

## 2020-11-16 DIAGNOSIS — F1020 Alcohol dependence, uncomplicated: Secondary | ICD-10-CM | POA: Diagnosis not present

## 2020-11-17 ENCOUNTER — Telehealth: Payer: Self-pay | Admitting: Physician Assistant

## 2020-11-17 NOTE — Telephone Encounter (Signed)
Please review

## 2020-11-17 NOTE — Telephone Encounter (Signed)
Pt LVM stating she heard from [facility], that they had not received the pt's FMLA paperwork.  It was sent on 10/31.  Pls call her back to advise status.  Next 12/16

## 2020-11-17 NOTE — Telephone Encounter (Signed)
Alison Gomez has this to sign

## 2020-11-18 ENCOUNTER — Telehealth: Payer: Self-pay | Admitting: Physician Assistant

## 2020-11-18 DIAGNOSIS — Z0289 Encounter for other administrative examinations: Secondary | ICD-10-CM

## 2020-11-18 DIAGNOSIS — Z793 Long term (current) use of hormonal contraceptives: Secondary | ICD-10-CM | POA: Diagnosis not present

## 2020-11-18 DIAGNOSIS — Z3040 Encounter for surveillance of contraceptives, unspecified: Secondary | ICD-10-CM | POA: Diagnosis not present

## 2020-11-18 NOTE — Telephone Encounter (Signed)
Received fax from [place] ([facility] [place]) regarding Alison Gomez. Completion needed on Provider Statement. Placed on Traci's desk.

## 2020-11-18 NOTE — Telephone Encounter (Signed)
Pt LVM asking for status and call back.

## 2020-11-19 NOTE — Telephone Encounter (Signed)
Rosey Bath signed pt's forms and they were faxed today along with her notes.

## 2020-11-23 DIAGNOSIS — F32A Depression, unspecified: Secondary | ICD-10-CM | POA: Diagnosis not present

## 2020-11-23 DIAGNOSIS — F410 Panic disorder [episodic paroxysmal anxiety] without agoraphobia: Secondary | ICD-10-CM | POA: Diagnosis not present

## 2020-11-23 DIAGNOSIS — F1020 Alcohol dependence, uncomplicated: Secondary | ICD-10-CM | POA: Diagnosis not present

## 2020-12-02 ENCOUNTER — Telehealth: Payer: Self-pay | Admitting: Physician Assistant

## 2020-12-02 NOTE — Telephone Encounter (Signed)
Yes last week she decided to start taking 2.She will call back when she is running out so we can update rx

## 2020-12-02 NOTE — Telephone Encounter (Signed)
noted 

## 2020-12-02 NOTE — Telephone Encounter (Signed)
Please review

## 2020-12-02 NOTE — Telephone Encounter (Signed)
Next visit is 12/17/20. Alison Gomez called stating she is having panic attacks again. Wants to know if she can take 2 of the Seroquel now to total 300 mg? She is currently taking 2 of the 150 mg. Her phone number is [phone].

## 2020-12-02 NOTE — Telephone Encounter (Signed)
Please clarify. Is she taking 2 already? Yes, she can take 2= Seroquel XR 300 mg.

## 2020-12-08 DIAGNOSIS — F410 Panic disorder [episodic paroxysmal anxiety] without agoraphobia: Secondary | ICD-10-CM | POA: Diagnosis not present

## 2020-12-08 DIAGNOSIS — F32A Depression, unspecified: Secondary | ICD-10-CM | POA: Diagnosis not present

## 2020-12-08 DIAGNOSIS — F1020 Alcohol dependence, uncomplicated: Secondary | ICD-10-CM | POA: Diagnosis not present

## 2020-12-17 ENCOUNTER — Encounter: Payer: Self-pay | Admitting: Physician Assistant

## 2020-12-17 ENCOUNTER — Other Ambulatory Visit: Payer: Self-pay

## 2020-12-17 ENCOUNTER — Ambulatory Visit (INDEPENDENT_AMBULATORY_CARE_PROVIDER_SITE_OTHER): Payer: BC Managed Care – PPO | Admitting: Physician Assistant

## 2020-12-17 DIAGNOSIS — F411 Generalized anxiety disorder: Secondary | ICD-10-CM

## 2020-12-17 DIAGNOSIS — F32A Depression, unspecified: Secondary | ICD-10-CM | POA: Diagnosis not present

## 2020-12-17 DIAGNOSIS — F410 Panic disorder [episodic paroxysmal anxiety] without agoraphobia: Secondary | ICD-10-CM | POA: Diagnosis not present

## 2020-12-17 DIAGNOSIS — F319 Bipolar disorder, unspecified: Secondary | ICD-10-CM

## 2020-12-17 DIAGNOSIS — F1020 Alcohol dependence, uncomplicated: Secondary | ICD-10-CM | POA: Diagnosis not present

## 2020-12-17 MED ORDER — OLANZAPINE 10 MG PO TABS: 10.0000 mg | ORAL_TABLET | Freq: Every day | ORAL | 1 refills | Status: DC

## 2020-12-17 NOTE — Progress Notes (Signed)
[facility]  Patient ID: Alison Gomez,  MRN: [id number]  PCP: Alison Ladd, MD  Date of Evaluation: 12/17/2020 Time spent:30 minutes  Chief Complaint:  Chief Complaint   Anxiety; Depression; Follow-up      HISTORY/CURRENT STATUS: HPI For routine med check.  Has been out of work since 11/01/2020. Has been very anxious, Seroquel has helped some but not enough and it is making her extremely drowsy and sluggish for several hours the next morning when she wakes up.  She asks if there is something else that she could take instead of the Seroquel that will help but not cause so much drowsiness.    Continues to struggle with generalized anxiety.  She is having some panic attacks with certain triggers although she does not name them.  She is seeing a Veterinary surgeon at [facility], Alison Gomez.  Therapy is difficult but she knows it is going to help her in the long run.  When she gets panicky, she will start to cry and feel overwhelmed, gets jittery and kind of shortness of breath for a few minutes.  The Ativan does help.  She has been out of work on short-term disability, but her claim was denied and she is having to do an appeal.  That in itself is making her more anxious, not having any money coming in.  There has been no way that her mental health would have been stable enough for her to work and she understands and agrees with that.  "I want to feel good enough to go back to work."  Patient denies loss of interest in usual activities and is able to enjoy things.  Denies decreased energy or motivation.  Appetite has not changed.  She does get really sad thinking about her illness, the finances because her disability has been denied and she is having to do an appeal.  Denies suicidal or homicidal thoughts.  Patient denies increased energy with decreased need for sleep, no increased talkativeness, no impulsivity or risky behaviors, no  increased spending but she now tells me that earlier this year she spent a lot of money when she was manic.  She has no increased libido, no grandiosity, no increased irritability or anger, and no hallucinations.  Denies dizziness, syncope, seizures, numbness, tingling, tremor, tics, unsteady gait, slurred speech, confusion. Denies muscle or joint pain, stiffness, or dystonia. Denies unexplained weight loss, frequent infections, or sores that heal slowly.  No polyphagia, polydipsia, or polyuria. Denies visual changes or paresthesias.   Individual Medical History/ Review of Systems: Changes? :No    Past medications for mental health diagnoses include: Xanax, Seroquel, BuSpar caused severe headache  Allergies: Chocolate  Current Medications:  Current Outpatient Medications:    LORazepam (ATIVAN) 1 MG tablet, TAKE 1 TABLET BY MOUTH TWICE DAILY AS NEEDED FOR ANXIETY; MAY TAKE OCCASIONAL EXTRA 1/2 TO 1 TABLET DURING THE DAY IF NEEDED, Disp: 75 tablet, Rfl: 5   norelgestromin-ethinyl estradiol Burr Medico) 150-35 MCG/24HR transdermal patch, Xulane 150 mcg-35 mcg/24 hr transdermal patch, Disp: , Rfl:    OLANZapine (ZYPREXA) 10 MG tablet, Take 1 tablet (10 mg total) by mouth at bedtime., Disp: 30 tablet, Rfl: 1   acetaminophen (TYLENOL) 500 MG tablet, Take 1,000 mg by mouth daily as needed for moderate pain or headache. (Patient not taking: Reported on 08/12/2019), Disp: , Rfl:    ferrous sulfate 325 (65 FE) MG tablet, Take 325 mg by mouth daily with breakfast. (Patient not taking: Reported  on 10/15/2020), Disp: , Rfl:    ibuprofen (ADVIL,MOTRIN) 600 MG tablet, Take 1 tablet (600 mg total) by mouth every 6 (six) hours as needed. (Patient not taking: Reported on 10/15/2020), Disp: 30 tablet, Rfl: 0   misoprostol (CYTOTEC) 200 MCG tablet, Take 3 tablets (600 mcg total) by mouth once for 1 dose., Disp: 3 tablet, Rfl: 0   traMADol (ULTRAM) 50 MG tablet, Take 1 tablet (50 mg total) by mouth every 6 (six) hours as  needed. (Patient not taking: Reported on 02/21/2018), Disp: 6 tablet, Rfl: 0   zolpidem (AMBIEN) 10 MG tablet, Take 1 tablet (10 mg total) by mouth at bedtime as needed for sleep. (Patient not taking: Reported on 12/17/2020), Disp: 30 tablet, Rfl: 5 Medication Side Effects: none  Family Medical/ Social History: Changes? No  MENTAL HEALTH EXAM:  There were no vitals taken for this visit.There is no height or weight on file to calculate BMI.  General Appearance: Casual and Well Groomed  Eye Contact:  Good  Speech:  Clear and Coherent and Normal Rate  Volume:  Normal  Mood:  Anxious  Affect:  Tearful and Anxious  Thought Process:  Goal Directed and Descriptions of Associations: Intact  Orientation:  Full (Time, Place, and Person)  Thought Content: Logical   Suicidal Thoughts:  No  Homicidal Thoughts:  No  Memory:  WNL  Judgement:  Good  Insight:  Good  Psychomotor Activity:  Normal  Concentration:  Concentration: Good  Recall:  Good  Fund of Knowledge: Good  Language: Good  Assets:  Desire for Improvement  ADL's:  Intact  Cognition: WNL  Prognosis:  Good   DIAGNOSES:    ICD-10-CM   1. Bipolar I disorder (HCC)  F31.9     2. Generalized anxiety disorder  F41.1         Receiving Psychotherapy: Yes  Alison Gomez at [facility].   RECOMMENDATIONS:  PDMP reviewed.  Last Ambien filled 10/15/2020.  Last Ativan filled 10/15/2020. I provided 30 minutes of face to face time during this encounter, including time spent before and after the visit in records review, medical decision making, counseling pertinent to today's visit, and charting.  [name] is in no shape mentally to return back to work yet.  I recommend changing the Seroquel to a different antipsychotic that can help with anxiety and mood stabilization but yet not cause so much grogginess the next day.  She needs to be on that for at least 1 to 2 weeks before going back to work. I  recommend changing to Zyprexa.  It is a good choice for anxiety and of course treats bipolar disorder well.  She would like to try it.  Benefits, risks, and side effects were discussed and she accepts. Wean off Seroquel XR 150 mg, 1 p.o. nightly for 4 nights and then stop. Start Zyprexa 10 mg, 1 p.o. nightly, at the same time she is weaning off the Seroquel. Continue Ativan 1 mg, 1 po bid prn, w/ occas 1/2 during day prn. Continue Ambien 10 mg, 1 po qhs prn.  She is not needing it right now. I am extending her leave and will be sending this note to [place] as a part of the appeal process.  She has not been able to work due to her mental health disorder.   Out of work starting 11/01/2020 and can return on 12/28/2020. FMLA for 6 days/month intermittent. Continue counseling. Return in 4-6 wks.  Melony Overly, PA-C

## 2020-12-17 NOTE — Patient Instructions (Signed)
Wean off the Seroquel XR by taking 150 mg, 1 pill every night for 4 nights.   At the same time, start the Zyprexa.

## 2020-12-21 ENCOUNTER — Telehealth: Payer: Self-pay | Admitting: Physician Assistant

## 2020-12-21 NOTE — Telephone Encounter (Signed)
Alison Gomez came in today regarding her [employer] paperwork. States that they need notes from [facility] from the whole month of November. She needs for the same appeal letter faxed with notes following. Pls rtc if needed [phone]

## 2020-12-22 NOTE — Telephone Encounter (Signed)
Will review and get information together.

## 2020-12-23 NOTE — Telephone Encounter (Signed)
Thanks Traci.

## 2020-12-30 NOTE — Telephone Encounter (Signed)
Please review

## 2020-12-30 NOTE — Telephone Encounter (Signed)
Beth, pls advise.

## 2020-12-30 NOTE — Telephone Encounter (Signed)
Elon Jester, can you please verify that I have permission to speak with a [employer] representative?  I looked at the 12/24/2020 ROI but it is not clear to me.  Thank you.

## 2020-12-30 NOTE — Telephone Encounter (Signed)
Dr. Candise Che, a psychiatrist and an independent medical reviewer for [employer] is wanting Alison Gomez to call him back for a peer-to-peer review of [place]'s disability claim. His direct number is [phone].  Next appt 1/25

## 2020-12-31 NOTE — Telephone Encounter (Signed)
I spoke with Dr. Virgel Manifold at # above.  Gave him the synopsis of her condition from 10/15/2020 visit to present including phone calls in between visits, and return to work date 12/28/2020.

## 2021-01-12 DIAGNOSIS — F32A Depression, unspecified: Secondary | ICD-10-CM | POA: Diagnosis not present

## 2021-01-12 DIAGNOSIS — F1020 Alcohol dependence, uncomplicated: Secondary | ICD-10-CM | POA: Diagnosis not present

## 2021-01-12 DIAGNOSIS — F410 Panic disorder [episodic paroxysmal anxiety] without agoraphobia: Secondary | ICD-10-CM | POA: Diagnosis not present

## 2021-01-25 DIAGNOSIS — F1020 Alcohol dependence, uncomplicated: Secondary | ICD-10-CM | POA: Diagnosis not present

## 2021-01-25 DIAGNOSIS — F32A Depression, unspecified: Secondary | ICD-10-CM | POA: Diagnosis not present

## 2021-01-25 DIAGNOSIS — F410 Panic disorder [episodic paroxysmal anxiety] without agoraphobia: Secondary | ICD-10-CM | POA: Diagnosis not present

## 2021-01-26 ENCOUNTER — Ambulatory Visit (INDEPENDENT_AMBULATORY_CARE_PROVIDER_SITE_OTHER): Payer: BC Managed Care – PPO | Admitting: Physician Assistant

## 2021-01-26 ENCOUNTER — Other Ambulatory Visit: Payer: Self-pay

## 2021-01-26 ENCOUNTER — Encounter: Payer: Self-pay | Admitting: Physician Assistant

## 2021-01-26 DIAGNOSIS — F99 Mental disorder, not otherwise specified: Secondary | ICD-10-CM | POA: Diagnosis not present

## 2021-01-26 DIAGNOSIS — F411 Generalized anxiety disorder: Secondary | ICD-10-CM | POA: Diagnosis not present

## 2021-01-26 DIAGNOSIS — F5105 Insomnia due to other mental disorder: Secondary | ICD-10-CM | POA: Diagnosis not present

## 2021-01-26 DIAGNOSIS — F319 Bipolar disorder, unspecified: Secondary | ICD-10-CM | POA: Diagnosis not present

## 2021-01-26 MED ORDER — OLANZAPINE 10 MG PO TABS: 10.0000 mg | ORAL_TABLET | Freq: Every day | ORAL | 5 refills | Status: DC

## 2021-01-26 NOTE — Progress Notes (Signed)
[facility]  Patient ID: Alison Gomez,  MRN: [id number]  PCP: Ileana Ladd, MD  Date of Evaluation: 01/26/2021 Time spent:20 minutes  Chief Complaint:  Chief Complaint   Anxiety; Depression; Insomnia; Follow-up     HISTORY/CURRENT STATUS: HPI For routine med check.  Doing better since going off Seroquel and starting Zyprexa. Went back to work the end of Dec.  It has caused a lot of anxiety but she is able to work through it so far.  The Zyprexa does seem to be helping keep her mood stable and is not causing any side effects that she is aware of.  She is not taking the Ativan very often.  It is helpful when needed though.  Patient denies loss of interest in usual activities and is able to enjoy things.  Denies decreased energy or motivation.  Appetite has not changed.  No extreme sadness, tearfulness, or feelings of hopelessness.  Denies any changes in concentration, making decisions or remembering things.  She is sleeping well and not needing the Ambien.  Denies suicidal or homicidal thoughts.  Patient denies increased energy with decreased need for sleep, no increased talkativeness, no impulsivity or risky behaviors, no increased spending but she now tells me that earlier this year she spent a lot of money when she was manic.  She has no increased libido, no grandiosity, no increased irritability or anger, and no hallucinations.  Denies dizziness, syncope, seizures, numbness, tingling, tremor, tics, unsteady gait, slurred speech, confusion. Denies muscle or joint pain, stiffness, or dystonia. Denies unexplained weight loss, frequent infections, or sores that heal slowly.  No polyphagia, polydipsia, or polyuria. Denies visual changes or paresthesias.   Individual Medical History/ Review of Systems: Changes? :No    Past medications for mental health diagnoses include: Xanax, Seroquel, BuSpar caused severe headache  Allergies: Chocolate  Current Medications:   Current Outpatient Medications:    LORazepam (ATIVAN) 1 MG tablet, TAKE 1 TABLET BY MOUTH TWICE DAILY AS NEEDED FOR ANXIETY; MAY TAKE OCCASIONAL EXTRA 1/2 TO 1 TABLET DURING THE DAY IF NEEDED, Disp: 75 tablet, Rfl: 5   norelgestromin-ethinyl estradiol Burr Medico) 150-35 MCG/24HR transdermal patch, Xulane 150 mcg-35 mcg/24 hr transdermal patch, Disp: , Rfl:    acetaminophen (TYLENOL) 500 MG tablet, Take 1,000 mg by mouth daily as needed for moderate pain or headache. (Patient not taking: Reported on 08/12/2019), Disp: , Rfl:    ferrous sulfate 325 (65 FE) MG tablet, Take 325 mg by mouth daily with breakfast. (Patient not taking: Reported on 10/15/2020), Disp: , Rfl:    ibuprofen (ADVIL,MOTRIN) 600 MG tablet, Take 1 tablet (600 mg total) by mouth every 6 (six) hours as needed. (Patient not taking: Reported on 10/15/2020), Disp: 30 tablet, Rfl: 0   misoprostol (CYTOTEC) 200 MCG tablet, Take 3 tablets (600 mcg total) by mouth once for 1 dose., Disp: 3 tablet, Rfl: 0   OLANZapine (ZYPREXA) 10 MG tablet, Take 1 tablet (10 mg total) by mouth at bedtime., Disp: 30 tablet, Rfl: 5   traMADol (ULTRAM) 50 MG tablet, Take 1 tablet (50 mg total) by mouth every 6 (six) hours as needed. (Patient not taking: Reported on 02/21/2018), Disp: 6 tablet, Rfl: 0 Medication Side Effects: none  Family Medical/ Social History: Changes? Went back to work the end of December  MENTAL HEALTH EXAM:  There were no vitals taken for this visit.There is no height or weight on file to calculate BMI.  General Appearance: Casual and Well Groomed  Eye Contact:  Good  Speech:  Clear and Coherent and Normal Rate  Volume:  Normal  Mood:  Euthymic  Affect:  Congruent  Thought Process:  Goal Directed and Descriptions of Associations: Intact  Orientation:  Full (Time, Place, and Person)  Thought Content: Logical   Suicidal Thoughts:  No  Homicidal Thoughts:  No  Memory:  WNL  Judgement:  Good  Insight:  Good  Psychomotor Activity:   Normal  Concentration:  Concentration: Good  Recall:  Good  Fund of Knowledge: Good  Language: Good  Assets:  Desire for Improvement  ADL's:  Intact  Cognition: WNL  Prognosis:  Good   DIAGNOSES:    ICD-10-CM   1. Bipolar I disorder (HCC)  F31.9     2. Generalized anxiety disorder  F41.1     3. Insomnia due to other mental disorder  F51.05    F99        Receiving Psychotherapy: Yes  Annapurna Parvathaneni at [facility].   RECOMMENDATIONS:  PDMP reviewed.  Last Ambien filled 10/15/2020.  Last Ativan filled 10/15/2020. I provided 20 minutes of face to face time during this encounter, including time spent before and after the visit in records review, medical decision making, counseling pertinent to today's visit, and charting.  I am glad to see her doing better.  No medication changes are necessary. Continue Zyprexa 10 mg, 1 p.o. nightly. Continue Ativan 1 mg, 1 po bid prn, w/ occas 1/2 during day prn. FMLA for 6 days/month intermittent. Continue counseling with Annapurna Parvathaneni Return in 3 months.  Melony Overly, PA-C

## 2021-02-11 ENCOUNTER — Telehealth: Payer: Self-pay | Admitting: Physician Assistant

## 2021-02-11 NOTE — Telephone Encounter (Signed)
Pt called to say Alison Gomez is going to send over FMLA paperwork to complete for her. She is asking to be able to work from home 3 days per week and work in office 2 days per week.

## 2021-02-18 ENCOUNTER — Telehealth: Payer: Self-pay | Admitting: Physician Assistant

## 2021-02-18 NOTE — Telephone Encounter (Signed)
Received FMLA forms and letter. Placed in Traci's box 2/17

## 2021-02-24 DIAGNOSIS — F1020 Alcohol dependence, uncomplicated: Secondary | ICD-10-CM | POA: Diagnosis not present

## 2021-02-24 DIAGNOSIS — F32A Depression, unspecified: Secondary | ICD-10-CM | POA: Diagnosis not present

## 2021-02-24 DIAGNOSIS — F410 Panic disorder [episodic paroxysmal anxiety] without agoraphobia: Secondary | ICD-10-CM | POA: Diagnosis not present

## 2021-03-01 DIAGNOSIS — Z13220 Encounter for screening for lipoid disorders: Secondary | ICD-10-CM | POA: Diagnosis not present

## 2021-03-01 DIAGNOSIS — Z136 Encounter for screening for cardiovascular disorders: Secondary | ICD-10-CM | POA: Diagnosis not present

## 2021-03-01 DIAGNOSIS — Z713 Dietary counseling and surveillance: Secondary | ICD-10-CM | POA: Diagnosis not present

## 2021-03-01 DIAGNOSIS — Z6827 Body mass index (BMI) 27.0-27.9, adult: Secondary | ICD-10-CM | POA: Diagnosis not present

## 2021-03-02 ENCOUNTER — Telehealth: Payer: Self-pay | Admitting: Physician Assistant

## 2021-03-02 NOTE — Telephone Encounter (Signed)
Faxed completed form to [facility] for [employer]. Fax: [phone] Attention:  [place]  ?

## 2021-03-21 DIAGNOSIS — Z202 Contact with and (suspected) exposure to infections with a predominantly sexual mode of transmission: Secondary | ICD-10-CM | POA: Diagnosis not present

## 2021-03-21 DIAGNOSIS — Z6827 Body mass index (BMI) 27.0-27.9, adult: Secondary | ICD-10-CM | POA: Diagnosis not present

## 2021-03-21 DIAGNOSIS — N760 Acute vaginitis: Secondary | ICD-10-CM | POA: Diagnosis not present

## 2021-03-21 DIAGNOSIS — Z113 Encounter for screening for infections with a predominantly sexual mode of transmission: Secondary | ICD-10-CM | POA: Diagnosis not present

## 2021-03-21 DIAGNOSIS — Z01419 Encounter for gynecological examination (general) (routine) without abnormal findings: Secondary | ICD-10-CM | POA: Diagnosis not present

## 2021-04-11 ENCOUNTER — Telehealth: Payer: Self-pay | Admitting: Physician Assistant

## 2021-04-11 NOTE — Telephone Encounter (Signed)
Sending to your per our conversation.  ?

## 2021-04-11 NOTE — Telephone Encounter (Signed)
Patient lvm at 9:05 today stating that she needs to register her two cats as emotional support animals. She would like a rtc to discuss what needs to be down to get the cats registered. Ph: [phone] ?

## 2021-04-11 NOTE — Telephone Encounter (Signed)
Noted thanks °

## 2021-04-12 NOTE — Telephone Encounter (Signed)
Rtc to pt and she needs a general letter for where she lives that her 2 [place] cats are her emotional support animals. She reports it doesn't need to be addressed to anyone in particular.  ?Would like to pick up this afternoon if available. ? ? ?Alison Gomez is this okay for me to type up for you to sign?  ?

## 2021-04-12 NOTE — Telephone Encounter (Signed)
Yes, thank you.

## 2021-04-14 DIAGNOSIS — Z0289 Encounter for other administrative examinations: Secondary | ICD-10-CM

## 2021-04-15 NOTE — Telephone Encounter (Signed)
Pt notified letter ready for pick up at front desk.

## 2021-04-27 ENCOUNTER — Encounter: Payer: Self-pay | Admitting: Physician Assistant

## 2021-04-27 ENCOUNTER — Ambulatory Visit (INDEPENDENT_AMBULATORY_CARE_PROVIDER_SITE_OTHER): Payer: BC Managed Care – PPO | Admitting: Physician Assistant

## 2021-04-27 DIAGNOSIS — F319 Bipolar disorder, unspecified: Secondary | ICD-10-CM

## 2021-04-27 DIAGNOSIS — F5105 Insomnia due to other mental disorder: Secondary | ICD-10-CM | POA: Diagnosis not present

## 2021-04-27 DIAGNOSIS — F99 Mental disorder, not otherwise specified: Secondary | ICD-10-CM | POA: Diagnosis not present

## 2021-04-27 DIAGNOSIS — F411 Generalized anxiety disorder: Secondary | ICD-10-CM | POA: Diagnosis not present

## 2021-04-27 NOTE — Progress Notes (Signed)
[facility] ? ?Patient ID: Alison Gomez,  ?MRN: [id number] ? ?PCP: Vernie Shanks, MD ? ?Date of Evaluation: 04/27/2021 ?Time spent:30 minutes ? ?Chief Complaint:  ?Chief Complaint   ?Follow-up ?  ? ? ?HISTORY/CURRENT STATUS: ?HPI For routine med check. ? ?Doing really well, loves the Zyprexa. Work is still stressful, about the same, but she's able 'to balance it better.' Going to the gym more. Eating well. Feels much better. Missed 1/2 day of work last week and 30 minutes one day earlier this week, d/t anxiety and fatigue but doesn't miss much.  Still works from home a lot of the time.  Is still looking to transfer jobs within [place] of [place]. ? ?Patient denies loss of interest in usual activities and is able to enjoy things.  Denies decreased energy or motivation.  Appetite has not changed.  No extreme sadness, tearfulness, or feelings of hopelessness.  Denies any changes in concentration, making decisions or remembering things.  ADLs and personal hygiene are normal.  Does occasionally have anxiety and takes the Ativan, it is helpful.  Not having panic attacks but more of a sense of being overwhelmed when she does have to take it.  Sleeps well.  Denies suicidal or homicidal thoughts. ? ?Patient denies increased energy with decreased need for sleep, no increased talkativeness, no racing thoughts, no impulsivity or risky behaviors, no increased spending, no increased libido, no grandiosity, no increased irritability or anger, and no hallucinations. ? ?Denies dizziness, syncope, seizures, numbness, tingling, tremor, tics, unsteady gait, slurred speech, confusion. Denies muscle or joint pain, stiffness, or dystonia. Denies unexplained weight loss, frequent infections, or sores that heal slowly.  No polyphagia, polydipsia, or polyuria. Denies visual changes or paresthesias.  ? ?Individual Medical History/ Review of Systems: Changes? :No   ? ?Past medications for mental health diagnoses include: ?Xanax,  Seroquel, BuSpar caused severe headache ? ?Allergies: Chocolate ? ?Current Medications:  ?Current Outpatient Medications:  ?  LORazepam (ATIVAN) 1 MG tablet, TAKE 1 TABLET BY MOUTH TWICE DAILY AS NEEDED FOR ANXIETY; MAY TAKE OCCASIONAL EXTRA 1/2 TO 1 TABLET DURING THE DAY IF NEEDED, Disp: 75 tablet, Rfl: 5 ?  norelgestromin-ethinyl estradiol Marilu Favre) 150-35 MCG/24HR transdermal patch, Xulane 150 mcg-35 mcg/24 hr transdermal patch, Disp: , Rfl:  ?  OLANZapine (ZYPREXA) 10 MG tablet, Take 1 tablet (10 mg total) by mouth at bedtime., Disp: 30 tablet, Rfl: 5 ?  acetaminophen (TYLENOL) 500 MG tablet, Take 1,000 mg by mouth daily as needed for moderate pain or headache. (Patient not taking: Reported on 08/12/2019), Disp: , Rfl:  ?  ferrous sulfate 325 (65 FE) MG tablet, Take 325 mg by mouth daily with breakfast. (Patient not taking: Reported on 10/15/2020), Disp: , Rfl:  ?  ibuprofen (ADVIL,MOTRIN) 600 MG tablet, Take 1 tablet (600 mg total) by mouth every 6 (six) hours as needed. (Patient not taking: Reported on 10/15/2020), Disp: 30 tablet, Rfl: 0 ?  misoprostol (CYTOTEC) 200 MCG tablet, Take 3 tablets (600 mcg total) by mouth once for 1 dose., Disp: 3 tablet, Rfl: 0 ?  traMADol (ULTRAM) 50 MG tablet, Take 1 tablet (50 mg total) by mouth every 6 (six) hours as needed. (Patient not taking: Reported on 02/21/2018), Disp: 6 tablet, Rfl: 0 ?Medication Side Effects: none ? ?Family Medical/ Social History: Changes? no ? ?MENTAL HEALTH EXAM: ? ?There were no vitals taken for this visit.There is no height or weight on file to calculate BMI.  ?General Appearance: Casual and Well Groomed  ?Eye  Contact:  Good  ?Speech:  Clear and Coherent and Normal Rate  ?Volume:  Normal  ?Mood:  Euthymic  ?Affect:  Congruent  ?Thought Process:  Goal Directed and Descriptions of Associations: Intact  ?Orientation:  Full (Time, Place, and Person)  ?Thought Content: Logical   ?Suicidal Thoughts:  No  ?Homicidal Thoughts:  No  ?Memory:  WNL   ?Judgement:  Good  ?Insight:  Good  ?Psychomotor Activity:  Normal  ?Concentration:  Concentration: Good and Attention Span: Good  ?Recall:  Good  ?Fund of Knowledge: Good  ?Language: Good  ?Assets:  Desire for Improvement  ?ADL's:  Intact  ?Cognition: WNL  ?Prognosis:  Good  ? ?DIAGNOSES:  ?  ICD-10-CM   ?1. Bipolar I disorder ([place])  F31.9   ?  ?2. Generalized anxiety disorder  F41.1   ?  ?3. Insomnia due to other mental disorder  F51.05   ? F99   ?  ? ? ? ? ?Receiving Psychotherapy: Yes  Annapurna Parvathaneni at [facility]. ? ? ?RECOMMENDATIONS:  ?PDMP reviewed.  Last Ambien filled 10/15/2020.  Last Ativan filled 10/15/2020. ?I provided 30 minutes of face to face time during this encounter, including time spent before and after the visit in records review, medical decision making, counseling pertinent to today's visit, and charting.  ?She is doing really well so no change in treatment will be made. ? ?Continue Zyprexa 10 mg, 1 p.o. nightly. ?Continue Ativan 1 mg, 1 po bid prn, w/ occas 1/2 during day prn. ?FMLA for 6 days/month intermittent. ?She'll see PCP soon for labs, make sure she gets glucose and lipid panel done.  She understands. ?Continue counseling with Annapurna Parvathaneni. ?Return in 4 months. ? ?Donnal Moat, PA-C  ?

## 2021-05-09 ENCOUNTER — Telehealth: Payer: Self-pay | Admitting: Physician Assistant

## 2021-05-09 NOTE — Telephone Encounter (Signed)
Pt LVM asking if FMLA paperwork was going to reflect same days as previous. 6 days a month and 2 additional breaks daily. Contact# [phone]. ?

## 2021-05-10 NOTE — Telephone Encounter (Signed)
Please check on this. 6 days will be the same. 2 additional 15 min breaks/day is fine too. ?Thanks

## 2021-05-11 ENCOUNTER — Encounter (HOSPITAL_BASED_OUTPATIENT_CLINIC_OR_DEPARTMENT_OTHER): Payer: Self-pay | Admitting: Emergency Medicine

## 2021-05-11 ENCOUNTER — Emergency Department (HOSPITAL_BASED_OUTPATIENT_CLINIC_OR_DEPARTMENT_OTHER)
Admission: EM | Admit: 2021-05-11 | Discharge: 2021-05-11 | Disposition: A | Discharge status: Home / Self Care | Payer: BC Managed Care – PPO | Attending: Emergency Medicine | Admitting: Emergency Medicine

## 2021-05-11 ENCOUNTER — Emergency Department (HOSPITAL_BASED_OUTPATIENT_CLINIC_OR_DEPARTMENT_OTHER): Payer: BC Managed Care – PPO

## 2021-05-11 ENCOUNTER — Other Ambulatory Visit: Payer: Self-pay

## 2021-05-11 ENCOUNTER — Other Ambulatory Visit (HOSPITAL_BASED_OUTPATIENT_CLINIC_OR_DEPARTMENT_OTHER): Payer: Self-pay

## 2021-05-11 DIAGNOSIS — M79604 Pain in right leg: Secondary | ICD-10-CM | POA: Diagnosis not present

## 2021-05-11 DIAGNOSIS — S335XXA Sprain of ligaments of lumbar spine, initial encounter: Secondary | ICD-10-CM | POA: Diagnosis not present

## 2021-05-11 DIAGNOSIS — S39012A Strain of muscle, fascia and tendon of lower back, initial encounter: Secondary | ICD-10-CM | POA: Diagnosis not present

## 2021-05-11 DIAGNOSIS — R202 Paresthesia of skin: Secondary | ICD-10-CM | POA: Diagnosis not present

## 2021-05-11 DIAGNOSIS — S3992XA Unspecified injury of lower back, initial encounter: Secondary | ICD-10-CM | POA: Diagnosis not present

## 2021-05-11 DIAGNOSIS — M5116 Intervertebral disc disorders with radiculopathy, lumbar region: Secondary | ICD-10-CM | POA: Diagnosis not present

## 2021-05-11 DIAGNOSIS — R700 Elevated erythrocyte sedimentation rate: Secondary | ICD-10-CM | POA: Insufficient documentation

## 2021-05-11 DIAGNOSIS — M5126 Other intervertebral disc displacement, lumbar region: Secondary | ICD-10-CM | POA: Diagnosis not present

## 2021-05-11 DIAGNOSIS — X500XXA Overexertion from strenuous movement or load, initial encounter: Secondary | ICD-10-CM | POA: Diagnosis not present

## 2021-05-11 DIAGNOSIS — R200 Anesthesia of skin: Secondary | ICD-10-CM | POA: Diagnosis not present

## 2021-05-11 DIAGNOSIS — M545 Low back pain, unspecified: Secondary | ICD-10-CM | POA: Diagnosis not present

## 2021-05-11 LAB — CBC WITH DIFFERENTIAL/PLATELET
Abs Immature Granulocytes: 0.01 10*3/uL (ref 0.00–0.07)
Basophils Absolute: 0 10*3/uL (ref 0.0–0.1)
Basophils Relative: 0 %
Eosinophils Absolute: 0.1 10*3/uL (ref 0.0–0.5)
Eosinophils Relative: 2 %
HCT: 33.7 % — ABNORMAL LOW (ref 36.0–46.0)
Hemoglobin: 10.9 g/dL — ABNORMAL LOW (ref 12.0–15.0)
Immature Granulocytes: 0 %
Lymphocytes Relative: 34 %
Lymphs Abs: 1.9 10*3/uL (ref 0.7–4.0)
MCH: 28.2 pg (ref 26.0–34.0)
MCHC: 32.3 g/dL (ref 30.0–36.0)
MCV: 87.1 fL (ref 80.0–100.0)
Monocytes Absolute: 0.5 10*3/uL (ref 0.1–1.0)
Monocytes Relative: 9 %
Neutro Abs: 3 10*3/uL (ref 1.7–7.7)
Neutrophils Relative %: 55 %
Platelets: 366 10*3/uL (ref 150–400)
RBC: 3.87 MIL/uL (ref 3.87–5.11)
RDW: 17.4 % — ABNORMAL HIGH (ref 11.5–15.5)
WBC: 5.6 10*3/uL (ref 4.0–10.5)
nRBC: 0 % (ref 0.0–0.2)

## 2021-05-11 LAB — BASIC METABOLIC PANEL
Anion gap: 9 (ref 5–15)
BUN: 12 mg/dL (ref 6–20)
CO2: 24 mmol/L (ref 22–32)
Calcium: 9.2 mg/dL (ref 8.9–10.3)
Chloride: 105 mmol/L (ref 98–111)
Creatinine, Ser: 0.95 mg/dL (ref 0.44–1.00)
GFR, Estimated: >60 mL/min (ref >60)
Glucose, Bld: 82 mg/dL (ref 70–99)
Potassium: 3.9 mmol/L (ref 3.5–5.1)
Sodium: 138 mmol/L (ref 135–145)

## 2021-05-11 LAB — HCG, QUANTITATIVE, PREGNANCY: hCG, Beta Chain, Quant, S: <1 m[IU]/mL (ref <5)

## 2021-05-11 LAB — SEDIMENTATION RATE: Sed Rate: 32 mm/hr — ABNORMAL HIGH (ref 0–22)

## 2021-05-11 LAB — C-REACTIVE PROTEIN: CRP: 1.7 mg/dL — ABNORMAL HIGH (ref <1.0)

## 2021-05-11 MED ORDER — METHYLPREDNISOLONE 4 MG PO TBPK
ORAL_TABLET | ORAL | 0 refills | Status: DC
  Filled 2021-05-11: qty 21, 6d supply, fill #0

## 2021-05-11 MED ORDER — LIDOCAINE 5 % EX PTCH
1.0000 | MEDICATED_PATCH | CUTANEOUS | Status: DC
  Administered 2021-05-11: 1 via TRANSDERMAL
  Filled 2021-05-11: qty 1

## 2021-05-11 MED ORDER — KETOROLAC TROMETHAMINE 30 MG/ML IJ SOLN
30.0000 mg | Freq: Once | INTRAMUSCULAR | Status: AC
  Administered 2021-05-11: 30 mg via INTRAMUSCULAR
  Filled 2021-05-11: qty 1

## 2021-05-11 MED ORDER — OXYCODONE-ACETAMINOPHEN 5-325 MG PO TABS
1.0000 | ORAL_TABLET | Freq: Four times a day (QID) | ORAL | 0 refills | Status: DC | PRN
Start: 2021-05-11 — End: 2023-10-25
  Filled 2021-05-11: qty 15, 2d supply, fill #0

## 2021-05-11 MED ORDER — HYDROCODONE-ACETAMINOPHEN 5-325 MG PO TABS
2.0000 | ORAL_TABLET | Freq: Once | ORAL | Status: AC
  Administered 2021-05-11: 2 via ORAL
  Filled 2021-05-11: qty 2

## 2021-05-11 MED ORDER — NAPROXEN 500 MG PO TABS
500.0000 mg | ORAL_TABLET | Freq: Two times a day (BID) | ORAL | 0 refills | Status: DC
  Filled 2021-05-11: qty 30, 15d supply, fill #0

## 2021-05-11 MED ORDER — LIDOCAINE 5 % EX PTCH
1.0000 | MEDICATED_PATCH | CUTANEOUS | 0 refills | Status: AC
  Filled 2021-05-11: qty 30, 30d supply, fill #0

## 2021-05-11 MED ORDER — CYCLOBENZAPRINE HCL 5 MG PO TABS
5.0000 mg | ORAL_TABLET | Freq: Once | ORAL | Status: AC
  Administered 2021-05-11: 5 mg via ORAL
  Filled 2021-05-11: qty 1

## 2021-05-11 NOTE — ED Notes (Signed)
RT note: U/A labelled/given to Lab. ?

## 2021-05-11 NOTE — Telephone Encounter (Signed)
Noted I will check her pw ?

## 2021-05-11 NOTE — ED Provider Notes (Signed)
?[facility] EMERGENCY DEPT ?Provider Note ? ? ?CSN: [id number] ?Arrival date & time: 05/11/21  1045 ? ?  ? ?History ? ?Chief Complaint  ?Patient presents with  ? Leg Pain  ? ? ?Alison Gomez is a 36 y.o. female. ? ? ?Leg Pain ?Associated symptoms: back pain   ? ?36 year old female with medical history significant for pericarditis, panic disorder, anxiety, depression, bipolar disorder who presents to the emergency department with subacute low back pain with radiculopathy.  The patient states that she was weightlifting 1.5 weeks ago and doing dead lifts.  While attempting a dead lift, she felt a sudden pop in her lower back and immediately sustained pain.  She has had worsening pain with radiculopathy down her right leg.  She endorses pain with range of motion, worse with twisting and attempts at lifting the leg.  She endorses shooting pain from her low back down to her right leg.  She endorses numbness and tingling in her toes.  She denies any urinary or fecal incontinence.  She denies any saddle anesthesia.  She denies any fevers or chills.  She denies any history of IV drug abuse. ? ?Home Medications ?Prior to Admission medications   ?Medication Sig Start Date End Date Taking? Authorizing Provider  ?lidocaine (LIDODERM) 5 % Place 1 patch onto the skin daily. Remove & Discard patch within 12 hours or as directed by MD 05/11/21  Yes Regan Lemming, MD  ?methylPREDNISolone (MEDROL DOSEPAK) 4 MG TBPK tablet Follow directions on the pack 05/11/21  Yes Regan Lemming, MD  ?naproxen (NAPROSYN) 500 MG tablet Take 1 tablet (500 mg total) by mouth 2 (two) times daily. 05/11/21  Yes Regan Lemming, MD  ?oxyCODONE-acetaminophen (PERCOCET/ROXICET) 5-325 MG tablet Take 1-2 tablets by mouth every 6 (six) hours as needed for severe pain. 05/11/21  Yes Regan Lemming, MD  ?ibuprofen (ADVIL,MOTRIN) 600 MG tablet Take 1 tablet (600 mg total) by mouth every 6 (six) hours as needed. ?Patient not taking: Reported on  10/15/2020 05/01/17   Virginia Rochester, NP  ?LORazepam (ATIVAN) 1 MG tablet TAKE 1 TABLET BY MOUTH TWICE DAILY AS NEEDED FOR ANXIETY; MAY TAKE OCCASIONAL EXTRA 1/2 TO 1 TABLET DURING THE DAY IF NEEDED 10/15/20   Donnal Moat T, PA-C  ?misoprostol (CYTOTEC) 200 MCG tablet Take 3 tablets (600 mcg total) by mouth once for 1 dose. 05/01/17 05/01/17  Virginia Rochester, NP  ?norelgestromin-ethinyl estradiol Marilu Favre) 150-35 MCG/24HR transdermal patch Xulane 150 mcg-35 mcg/24 hr transdermal patch    [provider]  ?OLANZapine (ZYPREXA) 10 MG tablet Take 1 tablet (10 mg total) by mouth at bedtime. 01/26/21   Donnal Moat T, PA-C  ?traMADol (ULTRAM) 50 MG tablet Take 1 tablet (50 mg total) by mouth every 6 (six) hours as needed. ?Patient not taking: Reported on 02/21/2018 05/01/17   Virginia Rochester, NP  ?   ? ?Allergies    ?Chocolate   ? ?Review of Systems   ?Review of Systems  ?Musculoskeletal:  Positive for back pain.  ?Neurological:  Positive for numbness.  ?All other systems reviewed and are negative. ? ?Physical Exam ?Updated Vital Signs ?BP (!) 154/100   Pulse 73   Temp 98.4 ?F (36.9 ?C) (Oral)   Resp 14   SpO2 100%  ?Physical Exam ?Vitals and nursing note reviewed.  ?Constitutional:   ?   General: She is not in acute distress. ?   Appearance: She is well-developed.  ?HENT:  ?   Head: Normocephalic and atraumatic.  ?Eyes:  ?  Conjunctiva/sclera: Conjunctivae normal.  ?Cardiovascular:  ?   Rate and Rhythm: Normal rate and regular rhythm.  ?Pulmonary:  ?   Effort: Pulmonary effort is normal. No respiratory distress.  ?Abdominal:  ?   Palpations: Abdomen is soft.  ?   Tenderness: There is no abdominal tenderness.  ?Musculoskeletal:     ?   General: No swelling.  ?   Cervical back: Neck supple.  ?   Comments: Midline TTP and right sided paraspinal muscle TTP of the lumbar spine. Positive straight leg raise test on the right.  ?Skin: ?   General: Skin is warm and dry.  ?   Capillary Refill: Capillary refill  takes less than 2 seconds.  ?Neurological:  ?   General: No focal deficit present.  ?   Mental Status: She is alert. Mental status is at baseline.  ?   Cranial Nerves: No cranial nerve deficit.  ?   Sensory: No sensory deficit.  ?   Motor: Weakness present.  ?   Comments: RLE 4/5 strength, limited by pain from radiculopathy. 5/5 strength all other extremities. No clear sensory deficit to light touch all four extremities.   ?Psychiatric:     ?   Mood and Affect: Mood normal. Affect is tearful.  ? ? ?ED Results / Procedures / Treatments   ?Labs ?(all labs ordered are listed, but only abnormal results are displayed) ?Labs Reviewed  ?CBC WITH DIFFERENTIAL/PLATELET - Abnormal; Notable for the following components:  ?    Result Value  ? Hemoglobin 10.9 (*)   ? HCT 33.7 (*)   ? RDW 17.4 (*)   ? All other components within normal limits  ?SEDIMENTATION RATE - Abnormal; Notable for the following components:  ? Sed Rate 32 (*)   ? All other components within normal limits  ?HCG, QUANTITATIVE, PREGNANCY  ?BASIC METABOLIC PANEL  ?C-REACTIVE PROTEIN  ? ? ?EKG ?None ? ?Radiology ?CT Lumbar Spine Wo Contrast ? ?Result Date: 05/11/2021 ?CLINICAL DATA:  Myelopathy, acute, lumbar spine. Right leg pain with numbness and tingling in the feet. EXAM: CT LUMBAR SPINE WITHOUT CONTRAST TECHNIQUE: Multidetector CT imaging of the lumbar spine was performed without intravenous contrast administration. Multiplanar CT image reconstructions were also generated. RADIATION DOSE REDUCTION: This exam was performed according to the departmental dose-optimization program which includes automated exposure control, adjustment of the mA and/or kV according to patient size and/or use of iterative reconstruction technique. COMPARISON:  Abdominopelvic CT 09/07/2012 FINDINGS: Segmentation: There are 5 lumbar type vertebral bodies. Alignment: Normal. Vertebrae: No evidence of acute fracture or pars defect. No suspicious osseous lesions. Paraspinal and other  soft tissues: Unremarkable paraspinal soft tissues. The visualized lung bases are clear. Disc levels: The disc heights are maintained at each level. From T10-11 through L1-2, there is no evidence of disc herniation or significant spinal stenosis by CT. L2-3: Mild disc bulging eccentric to the right. No evidence of significant spinal stenosis. L3-4: Normal interspace. L4-5: Disc bulging with a broad-based right paracentral disc protrusion which narrows the right lateral recess and may contribute to right L5 nerve root encroachment in the canal. No significant foraminal narrowing. L5-S1: Mild disc bulging.  No evidence of spinal stenosis. IMPRESSION: 1. No acute lumbar spine osseous findings or malalignment. 2. Broad-based right paracentral disc protrusion at L4-5 with resulting narrowing of the right lateral recess and possible encroachment on the right L5 nerve root in the canal. This could be better evaluated with nonemergent MRI if clinically warranted. 3. No other evidence  of significant spinal stenosis or nerve root encroachment by CT. Electronically Signed   By: Richardean Sale M.D.   On: 05/11/2021 14:09   ? ?Procedures ?Procedures  ? ? ?Medications Ordered in ED ?Medications  ?lidocaine (LIDODERM) 5 % 1 patch (1 patch Transdermal Patch Applied 05/11/21 1159)  ?HYDROcodone-acetaminophen (NORCO/VICODIN) 5-325 MG per tablet 2 tablet (2 tablets Oral Given 05/11/21 1159)  ?cyclobenzaprine (FLEXERIL) tablet 5 mg (5 mg Oral Given 05/11/21 1159)  ?ketorolac (TORADOL) 30 MG/ML injection 30 mg (30 mg Intramuscular Given 05/11/21 1200)  ? ? ?ED Course/ Medical Decision Making/ A&P ?  ?                        ?Medical Decision Making ?Amount and/or Complexity of Data Reviewed ?Labs: ordered. ?Radiology: ordered. ? ?Risk ?Prescription drug management. ? ? ?36 year old female with medical history significant for pericarditis, panic disorder, anxiety, depression, bipolar disorder who presents to the emergency department with  subacute low back pain with radiculopathy.  The patient states that she was weightlifting 1.5 weeks ago and doing dead lifts.  While attempting a dead lift, she felt a sudden pop in her lower back and immediately sustai

## 2021-05-11 NOTE — ED Triage Notes (Signed)
Right leg pain, feels like it is shooting down her leg , numbness, tingling in her feet.  ?

## 2021-05-11 NOTE — Discharge Instructions (Addendum)
You likely have a herniated disc in your lower back. Recommend rest, ice/heating pad, NSAIDS, short course of steroids, lidocaine patch, and opiates with a plan to follow-up with spine surgery. ? ?CT Imaging: ?IMPRESSION:  ?1. No acute lumbar spine osseous findings or malalignment.  ?2. Broad-based right paracentral disc protrusion at L4-5 with  ?resulting narrowing of the right lateral recess and possible  ?encroachment on the right L5 nerve root in the canal. This could be  ?better evaluated with nonemergent MRI if clinically warranted.  ?3. No other evidence of significant spinal stenosis or nerve root  ?encroachment by CT.  ?   ? ? ?Treatment for this condition may include: ?A period of light, painless activity for a few days to several weeks. Complete bed rest is not recommended. ?If you have a herniated disk in your lower back, avoid sitting as it increases pressure on the disk. ?Medicines. These may include: ?NSAIDs, such as ibuprofen, to help reduce pain and swelling. ?Muscle relaxants to prevent sudden tightening of the back muscles (back spasms). ?Prescription pain medicines, if you have severe pain. ?Ice or heat therapy. ?Steroid injections in the area of the herniated disk. These can help reduce pain and swelling. ?Physical therapy to strengthen your back muscles. ?

## 2021-05-17 ENCOUNTER — Telehealth: Payer: Self-pay | Admitting: Physician Assistant

## 2021-05-17 NOTE — Telephone Encounter (Signed)
Received FMLA forms. Placed in Traci's box 5/16 ?

## 2021-05-24 ENCOUNTER — Telehealth: Payer: Self-pay | Admitting: Physician Assistant

## 2021-05-24 DIAGNOSIS — Z0289 Encounter for other administrative examinations: Secondary | ICD-10-CM

## 2021-05-24 NOTE — Telephone Encounter (Signed)
Illa called to check status of letter for her emotional support animals.  You wrote one in April and it needed additional information.  She said she called last week and gave the information that was needed.  Do you have this note requesting this update?  We are to call her when it is ready.

## 2021-05-24 NOTE — Telephone Encounter (Signed)
This has been completed and given to Rosey Bath to review and sign

## 2021-05-25 NOTE — Telephone Encounter (Signed)
I'm out of the office today, I got the note yesterday that she needs 2 separate letters, with different dx, for her 2 cats. (A letter was written a few months ago, but they want 2.) Would you mind typing 2 letters, one stating she has severe anxiety and the cats are necessary for her emotional well-being, and the other stating she has panic disorder, again that the cats are necessary for her condition. I'll be in tomorrow and will sign them. If you'd prefer to wait for me to write something more specific, I will. Thanks.

## 2021-05-26 DIAGNOSIS — M5440 Lumbago with sciatica, unspecified side: Secondary | ICD-10-CM | POA: Diagnosis not present

## 2021-06-02 ENCOUNTER — Other Ambulatory Visit: Payer: Self-pay | Admitting: Neurosurgery

## 2021-06-02 DIAGNOSIS — M5440 Lumbago with sciatica, unspecified side: Secondary | ICD-10-CM

## 2021-06-13 ENCOUNTER — Other Ambulatory Visit: Payer: Self-pay | Admitting: Physician Assistant

## 2021-06-16 ENCOUNTER — Ambulatory Visit
Admission: RE | Admit: 2021-06-16 | Discharge: 2021-06-16 | Disposition: A | Discharge status: Home / Self Care | Payer: BC Managed Care – PPO | Source: Ambulatory Visit | Attending: Neurosurgery | Admitting: Neurosurgery

## 2021-06-16 DIAGNOSIS — M545 Low back pain, unspecified: Secondary | ICD-10-CM | POA: Diagnosis not present

## 2021-06-16 DIAGNOSIS — M5440 Lumbago with sciatica, unspecified side: Secondary | ICD-10-CM

## 2021-07-15 DIAGNOSIS — M5126 Other intervertebral disc displacement, lumbar region: Secondary | ICD-10-CM | POA: Diagnosis not present

## 2021-08-03 DIAGNOSIS — M5126 Other intervertebral disc displacement, lumbar region: Secondary | ICD-10-CM | POA: Diagnosis not present

## 2021-08-15 DIAGNOSIS — Z113 Encounter for screening for infections with a predominantly sexual mode of transmission: Secondary | ICD-10-CM | POA: Diagnosis not present

## 2021-08-15 DIAGNOSIS — R300 Dysuria: Secondary | ICD-10-CM | POA: Diagnosis not present

## 2021-08-15 DIAGNOSIS — L739 Follicular disorder, unspecified: Secondary | ICD-10-CM | POA: Diagnosis not present

## 2021-08-17 ENCOUNTER — Telehealth: Payer: Self-pay | Admitting: Physician Assistant

## 2021-08-17 NOTE — Telephone Encounter (Signed)
[employer] dropped off a Medical Accomodations Request to complete. Placed in Traci's box for completion.

## 2021-08-22 DIAGNOSIS — Z0289 Encounter for other administrative examinations: Secondary | ICD-10-CM

## 2021-08-22 NOTE — Telephone Encounter (Signed)
Called pt to advise paperwork ready to pick up for accommodations form employer to work from home. Pt will pick up 8/22.

## 2021-08-22 NOTE — Telephone Encounter (Signed)
Paper work completed and given to Teresa to sign ?

## 2021-08-31 ENCOUNTER — Ambulatory Visit: Payer: BC Managed Care – PPO | Admitting: Physician Assistant

## 2021-09-06 ENCOUNTER — Ambulatory Visit (INDEPENDENT_AMBULATORY_CARE_PROVIDER_SITE_OTHER): Payer: BC Managed Care – PPO | Admitting: Physician Assistant

## 2021-09-06 ENCOUNTER — Encounter: Payer: Self-pay | Admitting: Physician Assistant

## 2021-09-06 DIAGNOSIS — F319 Bipolar disorder, unspecified: Secondary | ICD-10-CM

## 2021-09-06 DIAGNOSIS — F99 Mental disorder, not otherwise specified: Secondary | ICD-10-CM

## 2021-09-06 DIAGNOSIS — F411 Generalized anxiety disorder: Secondary | ICD-10-CM | POA: Diagnosis not present

## 2021-09-06 DIAGNOSIS — F5105 Insomnia due to other mental disorder: Secondary | ICD-10-CM

## 2021-09-06 MED ORDER — LORAZEPAM 1 MG PO TABS: ORAL_TABLET | ORAL | 5 refills | Status: DC

## 2021-09-06 MED ORDER — OLANZAPINE 10 MG PO TABS: 10.0000 mg | ORAL_TABLET | Freq: Every day | ORAL | 1 refills | Status: DC

## 2021-09-06 NOTE — Progress Notes (Signed)
[facility] Med Check  Patient ID: Alison Gomez,  MRN: [id number]  PCP: Ileana Ladd, MD (Inactive)  Date of Evaluation: 09/06/2021 Time spent:30 minutes  Chief Complaint:  Chief Complaint   Anxiety; Depression; Follow-up    HISTORY/CURRENT STATUS: HPI For routine med check.  Very stressed at work. Is doing the jobs of 8 different people. Has been trying to change jobs within the company but there's a hiring freeze for now. She's hoping that will change in the next few months. Has been at [employer] for 9 years so doesn't want to leave. Is anxious at times but Ativan is helpful. Doesn't take often, but when she gets overwhelmed or when she flies she takes it.  Has been working from home 3 days a week and in the office 2 days a week.  States that she gets even more anxious if she does not have the work time at home.  Patient is able to enjoy things.  Energy and motivation are good.  No extreme sadness, tearfulness, or feelings of hopelessness.  Sleeps well most of the time but sometimes ruminating thoughts will keep her awake.  ADLs and personal hygiene are normal.   Denies any changes in concentration, making decisions, or remembering things.  Appetite has not changed.  Weight is stable.  Denies suicidal or homicidal thoughts.  Patient denies increased energy with decreased need for sleep, increased talkativeness, racing thoughts, impulsivity or risky behaviors, increased spending, increased libido, grandiosity, increased irritability or anger, paranoia, or hallucinations.  Denies dizziness, syncope, seizures, numbness, tingling, tremor, tics, unsteady gait, slurred speech, confusion. Denies muscle or joint pain, stiffness, or dystonia. Denies unexplained weight loss, frequent infections, or sores that heal slowly.  No polyphagia, polydipsia, or polyuria. Denies visual changes or paresthesias.   Individual Medical History/ Review of Systems: Changes? :No    Past medications  for mental health diagnoses include: Xanax, Seroquel, BuSpar caused severe headache  Allergies: Chocolate  Current Medications:  Current Outpatient Medications:    norelgestromin-ethinyl estradiol Burr Medico) 150-35 MCG/24HR transdermal patch, Xulane 150 mcg-35 mcg/24 hr transdermal patch, Disp: , Rfl:    ibuprofen (ADVIL,MOTRIN) 600 MG tablet, Take 1 tablet (600 mg total) by mouth every 6 (six) hours as needed. (Patient not taking: Reported on 10/15/2020), Disp: 30 tablet, Rfl: 0   lidocaine (LIDODERM) 5 %, Place 1 patch onto the skin daily. Remove & Discard patch within 12 hours or as directed by MD (Patient not taking: Reported on 09/06/2021), Disp: 30 patch, Rfl: 0   LORazepam (ATIVAN) 1 MG tablet, TAKE 1 TABLET BY MOUTH TWICE DAILY AS NEEDED FOR ANXIETY; MAY TAKE OCCASIONAL EXTRA 1/2 TO 1 TABLET DURING THE DAY IF NEEDED, Disp: 75 tablet, Rfl: 5   misoprostol (CYTOTEC) 200 MCG tablet, Take 3 tablets (600 mcg total) by mouth once for 1 dose., Disp: 3 tablet, Rfl: 0   naproxen (NAPROSYN) 500 MG tablet, Take 1 tablet (500 mg total) by mouth 2 (two) times daily. (Patient not taking: Reported on 09/06/2021), Disp: 30 tablet, Rfl: 0   OLANZapine (ZYPREXA) 10 MG tablet, Take 1 tablet (10 mg total) by mouth at bedtime., Disp: 90 tablet, Rfl: 1   oxyCODONE-acetaminophen (PERCOCET/ROXICET) 5-325 MG tablet, Take 1-2 tablets by mouth every 6 (six) hours as needed for severe pain. (Patient not taking: Reported on 09/06/2021), Disp: 15 tablet, Rfl: 0   traMADol (ULTRAM) 50 MG tablet, Take 1 tablet (50 mg total) by mouth every 6 (six) hours as needed. (Patient not taking: Reported  on 02/21/2018), Disp: 6 tablet, Rfl: 0 Medication Side Effects: none  Family Medical/ Social History: Changes? no  MENTAL HEALTH EXAM:  There were no vitals taken for this visit.There is no height or weight on file to calculate BMI.  General Appearance: Casual and Well Groomed  Eye Contact:  Good  Speech:  Clear and Coherent and Normal  Rate  Volume:  Normal  Mood:  Anxious  Affect:  Congruent  Thought Process:  Goal Directed and Descriptions of Associations: Intact  Orientation:  Full (Time, Place, and Person)  Thought Content: Logical   Suicidal Thoughts:  No  Homicidal Thoughts:  No  Memory:  WNL  Judgement:  Good  Insight:  Good  Psychomotor Activity:  Normal  Concentration:  Concentration: Good and Attention Span: Good  Recall:  Good  Fund of Knowledge: Good  Language: Good  Assets:  Desire for Improvement  ADL's:  Intact  Cognition: WNL  Prognosis:  Good   Labs 05/24/2021 CBC and BMP reviewed.  Glucose 82  02/24/2021 Total cholesterol 121, triglycerides 63, HDL 66, LDL 42.  Results under Care Everywhere.  DIAGNOSES:    ICD-10-CM   1. Generalized anxiety disorder  F41.1     2. Bipolar I disorder (HCC)  F31.9     3. Insomnia due to other mental disorder  F51.05    F99       Receiving Psychotherapy: Yes  Annapurna Parvathaneni at [facility].   RECOMMENDATIONS:  PDMP unable to review. I provided 30 minutes of face to face time during this encounter, including time spent before and after the visit in records review, medical decision making, counseling pertinent to today's visit, and charting.   She is doing well with her medications.  No medication changes are needed.    Encouraged her to take the lorazepam in the evening if she is having ruminating thoughts and cannot go to sleep.  She has never tried that.  Continue Ativan 1 mg, 1 po bid prn, w/ occas 1/2 during day prn. Continue Zyprexa 10 mg, 1 p.o. nightly. FMLA for 6 days/month intermittent.  Continue to work from home 3 days per week, in office 2 days a wk.  Continue counseling with Annapurna Parvathaneni. Return in 6 months.  Melony Overly, PA-C

## 2021-11-07 ENCOUNTER — Other Ambulatory Visit: Payer: Self-pay

## 2021-11-07 ENCOUNTER — Emergency Department (HOSPITAL_BASED_OUTPATIENT_CLINIC_OR_DEPARTMENT_OTHER)
Admission: EM | Admit: 2021-11-07 | Discharge: 2021-11-07 | Disposition: A | Discharge status: Home / Self Care | Payer: BC Managed Care – PPO | Attending: Emergency Medicine | Admitting: Emergency Medicine

## 2021-11-07 ENCOUNTER — Encounter (HOSPITAL_BASED_OUTPATIENT_CLINIC_OR_DEPARTMENT_OTHER): Payer: Self-pay | Admitting: Emergency Medicine

## 2021-11-07 ENCOUNTER — Emergency Department (HOSPITAL_BASED_OUTPATIENT_CLINIC_OR_DEPARTMENT_OTHER): Payer: BC Managed Care – PPO | Admitting: Radiology

## 2021-11-07 DIAGNOSIS — S99922A Unspecified injury of left foot, initial encounter: Secondary | ICD-10-CM | POA: Diagnosis not present

## 2021-11-07 DIAGNOSIS — S9032XA Contusion of left foot, initial encounter: Secondary | ICD-10-CM | POA: Diagnosis not present

## 2021-11-07 DIAGNOSIS — W208XXA Other cause of strike by thrown, projected or falling object, initial encounter: Secondary | ICD-10-CM | POA: Insufficient documentation

## 2021-11-07 DIAGNOSIS — M79672 Pain in left foot: Secondary | ICD-10-CM | POA: Diagnosis not present

## 2021-11-07 DIAGNOSIS — Z79899 Other long term (current) drug therapy: Secondary | ICD-10-CM | POA: Diagnosis not present

## 2021-11-07 MED ORDER — HYDROCODONE-ACETAMINOPHEN 5-325 MG PO TABS
1.0000 | ORAL_TABLET | Freq: Once | ORAL | Status: AC
  Administered 2021-11-07: 1 via ORAL
  Filled 2021-11-07: qty 1

## 2021-11-07 NOTE — Discharge Instructions (Addendum)
You have been seen today for your complaint of left foot pain. Your imaging showed no fractures or dislocations. Your discharge medications include ibuprofen and Tylenol.  You may alternate these 2 every 4 hours.  You may take up to 800 mg of ibuprofen and up to 1000 mg of Tylenol. Home care instructions are as follows:  You should rest, ice, compress the foot, and elevate it above the level of your heart when you are resting. Follow up with: Your primary care provider in 1 week Please seek immediate medical care if you develop any of the following symptoms: You have very bad pain. Your foot or toes are numb. Your foot or toes turn very light (pale) or cold. You cannot move your foot or ankle. Your foot feels warm when you touch it. At this time there does not appear to be the presence of an emergent medical condition, however there is always the potential for conditions to change. Please read and follow the below instructions.  Do not take your medicine if  develop an itchy rash, swelling in your mouth or lips, or difficulty breathing; call 911 and seek immediate emergency medical attention if this occurs.  You may review your lab tests and imaging results in their entirety on your MyChart account.  Please discuss all results of fully with your primary care provider and other specialist at your follow-up visit.  Note: Portions of this text may have been transcribed using voice recognition software. Every effort was made to ensure accuracy; however, inadvertent computerized transcription errors may still be present.

## 2021-11-07 NOTE — ED Triage Notes (Signed)
Pt via pov from home with left foot pain since last night; pt reports that she was at a restaurant and someone dropped a chair on her foot. Pt states foot is extremely painful, that bearing weight is unbearable. She also reports that she has taken 800 mg ibuprofen with no help. Pt alert & oriented, nad noted.

## 2021-11-07 NOTE — ED Provider Notes (Signed)
[facility] EMERGENCY DEPT Provider Note   CSN: [id number] Arrival date & time: 11/07/21  1055     History  Chief Complaint  Patient presents with   Foot Pain    Alison Gomez is a 36 y.o. female.  Presents the ED for evaluation of left foot pain.  She states that she was in a bar at approximately 8 PM when someone knocked over their stool and landed directly on the top of her left foot.  She states pain was instant at that time.  States that has progressively gotten worse.  Rates it at a 5 out of 10 at rest and a 10 out of 10 with movement.  States it is very difficult to walk secondary to pain.  States she iced and elevated it last night with no improvement in her symptoms.  Denies numbness, weakness or tingling.  Patient states that her 800 mg ibuprofen tablets have not helped her pain.   Foot Pain       Home Medications Prior to Admission medications   Medication Sig Start Date End Date Taking? Authorizing Provider  ibuprofen (ADVIL,MOTRIN) 600 MG tablet Take 1 tablet (600 mg total) by mouth every 6 (six) hours as needed. Patient not taking: Reported on 10/15/2020 05/01/17   Currie Paris, NP  lidocaine (LIDODERM) 5 % Place 1 patch onto the skin daily. Remove & Discard patch within 12 hours or as directed by MD Patient not taking: Reported on 09/06/2021 05/11/21   Ernie Avena, MD  LORazepam (ATIVAN) 1 MG tablet TAKE 1 TABLET BY MOUTH TWICE DAILY AS NEEDED FOR ANXIETY; MAY TAKE OCCASIONAL EXTRA 1/2 TO 1 TABLET DURING THE DAY IF NEEDED 09/06/21   Melony Overly T, PA-C  misoprostol (CYTOTEC) 200 MCG tablet Take 3 tablets (600 mcg total) by mouth once for 1 dose. 05/01/17 05/01/17  Burleson, Brand Males, NP  naproxen (NAPROSYN) 500 MG tablet Take 1 tablet (500 mg total) by mouth 2 (two) times daily. Patient not taking: Reported on 09/06/2021 05/11/21   Ernie Avena, MD  norelgestromin-ethinyl estradiol [employer]) 150-35 MCG/24HR transdermal patch Xulane 150 mcg-35 mcg/24  hr transdermal patch    [provider]  OLANZapine (ZYPREXA) 10 MG tablet Take 1 tablet (10 mg total) by mouth at bedtime. 09/06/21   Cherie Ouch, PA-C  oxyCODONE-acetaminophen (PERCOCET/ROXICET) 5-325 MG tablet Take 1-2 tablets by mouth every 6 (six) hours as needed for severe pain. Patient not taking: Reported on 09/06/2021 05/11/21   Ernie Avena, MD  traMADol (ULTRAM) 50 MG tablet Take 1 tablet (50 mg total) by mouth every 6 (six) hours as needed. Patient not taking: Reported on 02/21/2018 05/01/17   Currie Paris, NP      Allergies    Chocolate    Review of Systems   Review of Systems  Musculoskeletal:  Positive for myalgias.  All other systems reviewed and are negative.   Physical Exam Updated Vital Signs BP (!) 150/105 (BP Location: Left Arm)   Pulse 65   Temp 98.2 F (36.8 C) (Oral)   Resp 18   Ht 5\' 8"  (1.727 m)   Wt 90.3 kg   LMP 11/02/2021 (Approximate)   SpO2 97%   BMI 30.26 kg/m  Physical Exam Vitals and nursing note reviewed.  Constitutional:      General: She is not in acute distress.    Appearance: Normal appearance. She is normal weight. She is not ill-appearing.  HENT:     Head: Normocephalic and atraumatic.  Pulmonary:     Effort: Pulmonary effort is normal. No respiratory distress.  Abdominal:     General: Abdomen is flat.  Musculoskeletal:        General: Tenderness present. No swelling or deformity. Normal range of motion.     Cervical back: Neck supple.     Right lower leg: No edema.     Left lower leg: No edema.     Comments: Left foot with bruising on the dorsal aspect.  DP pulses 2+ bilaterally.  Ankle range of motion full.  Sensation intact.  Cap refill normal.  Significant tenderness to palpation to the dorsum of the foot.  Skin:    General: Skin is warm and dry.  Neurological:     Mental Status: She is alert and oriented to person, place, and time.  Psychiatric:        Mood and Affect: Mood normal.        Behavior:  Behavior normal.     ED Results / Procedures / Treatments   Labs (all labs ordered are listed, but only abnormal results are displayed) Labs Reviewed - No data to display  EKG None  Radiology DG Foot Complete Left  Result Date: 11/07/2021 CLINICAL DATA:  Injury with pain across the metatarsal bones. EXAM: LEFT FOOT - COMPLETE 3+ VIEW COMPARISON:  None Available. FINDINGS: There is no evidence of fracture or dislocation. There is no evidence of arthropathy or other focal bone abnormality. Soft tissues are unremarkable. IMPRESSION: Negative. Electronically Signed   By: Markus Daft M.D.   On: 11/07/2021 11:44    Procedures Procedures    Medications Ordered in ED Medications - No data to display  ED Course/ Medical Decision Making/ A&P Clinical Course as of 11/07/21 1402  Mon Nov 07, 2021  1358 DG Foot Complete Left I personally reviewed the image.  No fracture or dislocation [AS]    Clinical Course User Index [AS] Jaquisha Frech, Grafton Folk, PA-C                           Medical Decision Making Amount and/or Complexity of Data Reviewed Radiology: ordered.  This patient presents to the ED for concern of left foot pain, this involves an extensive number of treatment options, and is a complaint that carries with it a high risk of complications and morbidity.  The differential diagnosis includes fractures, contusions, sprains, strains  My initial workup includes x-ray left foot, postop shoe, pain management, crutches  Additional history obtained from: Nursing notes from this visit.  I ordered imaging studies including x-ray left foot I independently visualized and interpreted imaging which showed no fractures I agree with the radiologist interpretation  Afebrile, hemodynamically stable.  Patient is a 36 year old female who presents ED for evaluation of left foot pain after a barstool fell on it.  X-ray negative.  Neuro vascular exam negative.  Patient was given Norco in the ED and  she reported significant improvement in her pain.  She was given postop shoe as well as crutches.  She was observed ambulating in the ED.  Was given an Ace wrap to go home with.  Patient was educated on rice therapy and appropriate ibuprofen and Tylenol dosing.  I did encourage patient to follow-up with her primary care provider in 1 week.  Patient was observed for 2 hours after administration of Norco for signs of drowsiness or altered mental status.  Patient appeared normal at the time of discharge and  is stable to drive home.  Stable at discharge.  At this time there does not appear to be any evidence of an acute emergency medical condition and the patient appears stable for discharge with appropriate outpatient follow up. Diagnosis was discussed with patient who verbalizes understanding of care plan and is agreeable to discharge. I have discussed return precautions with patient who verbalizes understanding. Patient encouraged to follow-up with their PCP within 1 week. All questions answered.  Note: Portions of this report may have been transcribed using voice recognition software. Every effort was made to ensure accuracy; however, inadvertent computerized transcription errors may still be present.          Final Clinical Impression(s) / ED Diagnoses Final diagnoses:  None    Rx / DC Orders ED Discharge Orders     None         Michelle Piper, Cordelia Poche 11/07/21 1411    Ernie Avena, MD 11/07/21 1907

## 2021-11-08 DIAGNOSIS — R051 Acute cough: Secondary | ICD-10-CM | POA: Diagnosis not present

## 2021-11-08 DIAGNOSIS — Z03818 Encounter for observation for suspected exposure to other biological agents ruled out: Secondary | ICD-10-CM | POA: Diagnosis not present

## 2021-11-08 DIAGNOSIS — D509 Iron deficiency anemia, unspecified: Secondary | ICD-10-CM | POA: Diagnosis not present

## 2021-11-08 DIAGNOSIS — Z0000 Encounter for general adult medical examination without abnormal findings: Secondary | ICD-10-CM | POA: Diagnosis not present

## 2021-11-08 DIAGNOSIS — M79672 Pain in left foot: Secondary | ICD-10-CM | POA: Diagnosis not present

## 2021-11-08 DIAGNOSIS — E559 Vitamin D deficiency, unspecified: Secondary | ICD-10-CM | POA: Diagnosis not present

## 2021-12-12 DIAGNOSIS — L732 Hidradenitis suppurativa: Secondary | ICD-10-CM | POA: Diagnosis not present

## 2021-12-12 DIAGNOSIS — Z113 Encounter for screening for infections with a predominantly sexual mode of transmission: Secondary | ICD-10-CM | POA: Diagnosis not present

## 2021-12-12 DIAGNOSIS — N898 Other specified noninflammatory disorders of vagina: Secondary | ICD-10-CM | POA: Diagnosis not present

## 2022-03-20 ENCOUNTER — Ambulatory Visit (INDEPENDENT_AMBULATORY_CARE_PROVIDER_SITE_OTHER): Payer: Self-pay | Admitting: Physician Assistant

## 2022-03-20 DIAGNOSIS — Z91199 Patient's noncompliance with other medical treatment and regimen due to unspecified reason: Secondary | ICD-10-CM

## 2022-03-22 NOTE — Progress Notes (Signed)
No show

## 2022-04-14 ENCOUNTER — Encounter: Payer: Self-pay | Admitting: Physician Assistant

## 2022-04-14 ENCOUNTER — Ambulatory Visit (INDEPENDENT_AMBULATORY_CARE_PROVIDER_SITE_OTHER): Payer: BC Managed Care – PPO | Admitting: Physician Assistant

## 2022-04-14 ENCOUNTER — Telehealth: Payer: Self-pay | Admitting: Physician Assistant

## 2022-04-14 DIAGNOSIS — F99 Mental disorder, not otherwise specified: Secondary | ICD-10-CM

## 2022-04-14 DIAGNOSIS — Z79899 Other long term (current) drug therapy: Secondary | ICD-10-CM | POA: Diagnosis not present

## 2022-04-14 DIAGNOSIS — F411 Generalized anxiety disorder: Secondary | ICD-10-CM

## 2022-04-14 DIAGNOSIS — F319 Bipolar disorder, unspecified: Secondary | ICD-10-CM

## 2022-04-14 DIAGNOSIS — F5105 Insomnia due to other mental disorder: Secondary | ICD-10-CM

## 2022-04-14 MED ORDER — OLANZAPINE 10 MG PO TABS: 10.0000 mg | ORAL_TABLET | Freq: Every day | ORAL | 1 refills | Status: DC

## 2022-04-14 MED ORDER — LORAZEPAM 1 MG PO TABS: ORAL_TABLET | ORAL | 5 refills | Status: DC

## 2022-04-14 NOTE — Telephone Encounter (Signed)
Received FMLA paperwork. Given to [facility] /12

## 2022-04-14 NOTE — Progress Notes (Signed)
[facility]  Patient ID: Alison Gomez,  MRN: [id number]  PCP: Ileana Ladd, MD (Inactive)  Date of Evaluation: 04/14/2022 Time spent:30 minutes  Chief Complaint:  Chief Complaint   Anxiety; Depression; Follow-up    HISTORY/CURRENT STATUS: HPI For routine 6 month med check.  Is doing well. Work is still stressful but things are going fine. Is trying to transfer to a different dept.   Patient is able to enjoy things.  Energy and motivation are good.  No extreme sadness, tearfulness, or feelings of hopelessness.  Sleeps well. ADLs and personal hygiene are normal.   Denies any changes in concentration, making decisions, or remembering things.  Appetite has not changed.  Weight is stable.  Denies suicidal or homicidal thoughts.  Patient denies increased energy with decreased need for sleep, increased talkativeness, racing thoughts, impulsivity or risky behaviors, increased spending, increased libido, grandiosity, increased irritability or anger, paranoia, or hallucinations.  Denies dizziness, syncope, seizures, numbness, tingling, tremor, tics, unsteady gait, slurred speech, confusion. Denies muscle or joint pain, stiffness, or dystonia. Denies unexplained weight loss, frequent infections, or sores that heal slowly.  No polyphagia, polydipsia, or polyuria. Denies visual changes or paresthesias.   Individual Medical History/ Review of Systems: Changes? :No    Past medications for mental health diagnoses include: Xanax, Seroquel, BuSpar caused severe headache  Allergies: Chocolate  Current Medications:  Current Outpatient Medications:    norelgestromin-ethinyl estradiol [employer]ico) 150-35 MCG/24HR transdermal patch, Xulane 150 mcg-35 mcg/24 hr transdermal patch, Disp: , Rfl:    ibuprofen (ADVIL,MOTRIN) 600 MG tablet, Take 1 tablet (600 mg total) by mouth every 6 (six) hours as needed. (Patient not taking: Reported on 10/15/2020), Disp: 30 tablet, Rfl: 0   lidocaine  (LIDODERM) 5 %, Place 1 patch onto the skin daily. Remove & Discard patch within 12 hours or as directed by MD (Patient not taking: Reported on 09/06/2021), Disp: 30 patch, Rfl: 0   LORazepam (ATIVAN) 1 MG tablet, TAKE 1 TABLET BY MOUTH TWICE DAILY AS NEEDED FOR ANXIETY; MAY TAKE OCCASIONAL EXTRA 1/2 TO 1 TABLET DURING THE DAY IF NEEDED, Disp: 75 tablet, Rfl: 5   misoprostol (CYTOTEC) 200 MCG tablet, Take 3 tablets (600 mcg total) by mouth once for 1 dose., Disp: 3 tablet, Rfl: 0   naproxen (NAPROSYN) 500 MG tablet, Take 1 tablet (500 mg total) by mouth 2 (two) times daily. (Patient not taking: Reported on 09/06/2021), Disp: 30 tablet, Rfl: 0   OLANZapine (ZYPREXA) 10 MG tablet, Take 1 tablet (10 mg total) by mouth at bedtime., Disp: 90 tablet, Rfl: 1   oxyCODONE-acetaminophen (PERCOCET/ROXICET) 5-325 MG tablet, Take 1-2 tablets by mouth every 6 (six) hours as needed for severe pain. (Patient not taking: Reported on 09/06/2021), Disp: 15 tablet, Rfl: 0   traMADol (ULTRAM) 50 MG tablet, Take 1 tablet (50 mg total) by mouth every 6 (six) hours as needed. (Patient not taking: Reported on 02/21/2018), Disp: 6 tablet, Rfl: 0 Medication Side Effects: none  Family Medical/ Social History: Changes? Still at [employer] of [place], is trying to change to a different job in the company.   MENTAL HEALTH EXAM:  There were no vitals taken for this visit.There is no height or weight on file to calculate BMI.  General Appearance: Casual and Well Groomed  Eye Contact:  Good  Speech:  Clear and Coherent and Normal Rate  Volume:  Normal  Mood:  Euthymic  Affect:  Congruent  Thought Process:  Goal Directed and Descriptions of Associations: Intact  Orientation:  Full (Time, Place, and Person)  Thought Content: Logical   Suicidal Thoughts:  No  Homicidal Thoughts:  No  Memory:  WNL  Judgement:  Good  Insight:  Good  Psychomotor Activity:  Normal  Concentration:  Concentration: Good and Attention Span: Good  Recall:  Good   Fund of Knowledge: Good  Language: Good  Assets:  Desire for Improvement  ADL's:  Intact  Cognition: WNL  Prognosis:  Good   DIAGNOSES:    ICD-10-CM   1. Bipolar I disorder  F31.9 Hemoglobin A1c    Comprehensive metabolic panel    Lipid panel    CBC with Differential/Platelet    2. Generalized anxiety disorder  F41.1     3. Insomnia due to other mental disorder  F51.05    F99     4. Encounter for long-term (current) use of medications  Z79.899 Hemoglobin A1c    Comprehensive metabolic panel    Lipid panel    CBC with Differential/Platelet     Receiving Psychotherapy: Yes     RECOMMENDATIONS:  PDMP reviewed.  Ativan filled 01/27/2022. I provided 20 minutes of face to face time during this encounter, including time spent before and after the visit in records review, medical decision making, counseling pertinent to today's visit, and charting.   Over all she is doing very well.  No change in treatment needed.  Continue Ativan 1 mg, 1 po bid prn, w/ occas 1/2 during day prn. Continue Zyprexa 10 mg, 1 p.o. nightly. FMLA for 6 days/month intermittent.  Continue to work from home 3 days per week, in office 2 days a wk. Up to 2 additional breaks/day.  Continue counseling. Labs ordered as above. Return in 6 months.  Melony Overly, PA-C

## 2022-04-24 DIAGNOSIS — Z0289 Encounter for other administrative examinations: Secondary | ICD-10-CM

## 2022-04-24 NOTE — Telephone Encounter (Signed)
Paper work completed and given to Teresa to review and sign. 

## 2022-04-24 NOTE — Telephone Encounter (Signed)
Paper work faxed to [employer] of [place]

## 2022-05-01 DIAGNOSIS — Z202 Contact with and (suspected) exposure to infections with a predominantly sexual mode of transmission: Secondary | ICD-10-CM | POA: Diagnosis not present

## 2022-05-01 DIAGNOSIS — R208 Other disturbances of skin sensation: Secondary | ICD-10-CM | POA: Diagnosis not present

## 2022-05-01 DIAGNOSIS — R300 Dysuria: Secondary | ICD-10-CM | POA: Diagnosis not present

## 2022-05-01 DIAGNOSIS — N760 Acute vaginitis: Secondary | ICD-10-CM | POA: Diagnosis not present

## 2022-09-11 DIAGNOSIS — Z202 Contact with and (suspected) exposure to infections with a predominantly sexual mode of transmission: Secondary | ICD-10-CM | POA: Diagnosis not present

## 2022-09-11 DIAGNOSIS — N898 Other specified noninflammatory disorders of vagina: Secondary | ICD-10-CM | POA: Diagnosis not present

## 2022-10-09 DIAGNOSIS — Z3040 Encounter for surveillance of contraceptives, unspecified: Secondary | ICD-10-CM | POA: Diagnosis not present

## 2022-10-09 DIAGNOSIS — L739 Follicular disorder, unspecified: Secondary | ICD-10-CM | POA: Diagnosis not present

## 2022-10-09 DIAGNOSIS — N898 Other specified noninflammatory disorders of vagina: Secondary | ICD-10-CM | POA: Diagnosis not present

## 2022-10-14 ENCOUNTER — Other Ambulatory Visit: Payer: Self-pay | Admitting: Physician Assistant

## 2022-10-27 ENCOUNTER — Ambulatory Visit (INDEPENDENT_AMBULATORY_CARE_PROVIDER_SITE_OTHER): Payer: BC Managed Care – PPO | Admitting: Physician Assistant

## 2022-10-27 ENCOUNTER — Encounter: Payer: Self-pay | Admitting: Physician Assistant

## 2022-10-27 DIAGNOSIS — F5105 Insomnia due to other mental disorder: Secondary | ICD-10-CM

## 2022-10-27 DIAGNOSIS — F411 Generalized anxiety disorder: Secondary | ICD-10-CM

## 2022-10-27 DIAGNOSIS — F319 Bipolar disorder, unspecified: Secondary | ICD-10-CM

## 2022-10-27 DIAGNOSIS — Z79899 Other long term (current) drug therapy: Secondary | ICD-10-CM | POA: Diagnosis not present

## 2022-10-27 DIAGNOSIS — F99 Mental disorder, not otherwise specified: Secondary | ICD-10-CM

## 2022-10-27 MED ORDER — OLANZAPINE 10 MG PO TABS: 10.0000 mg | ORAL_TABLET | Freq: Every day | ORAL | 5 refills | Status: DC

## 2022-10-27 MED ORDER — LORAZEPAM 1 MG PO TABS: ORAL_TABLET | ORAL | 5 refills | Status: DC

## 2022-10-27 NOTE — Progress Notes (Unsigned)
[facility]  Patient ID: Alison Gomez,  MRN: [id number]  PCP: Ileana Ladd, MD (Inactive)  Date of Evaluation: 10/27/2022 Time spent:35 minutes  Chief Complaint:  Chief Complaint   Anxiety; Depression; Follow-up    HISTORY/CURRENT STATUS: HPI For routine 6 month med check.  [name] is doing well. Patient is able to enjoy things.  Energy and motivation are good.  Work is going well.  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  No extreme sadness, tearfulness, or feelings of hopelessness.  Sleeps well most of the time. ADLs and personal hygiene are normal.   Denies any changes in concentration, making decisions, or remembering things.  Appetite has not changed.  Weight is stable.  Anxiety is controlled.  Not having panic attacks.  She does get overwhelmed at times but is able to use tools that she has learned in therapy to help her get through it.  Denies suicidal or homicidal thoughts.  Patient denies increased energy with decreased need for sleep, increased talkativeness, racing thoughts, impulsivity or risky behaviors, increased spending, increased libido, grandiosity, increased irritability or anger, paranoia, or hallucinations.  Review of Systems  Constitutional: Negative.   HENT: Negative.    Eyes: Negative.   Respiratory: Negative.    Cardiovascular: Negative.   Gastrointestinal: Negative.   Genitourinary: Negative.   Musculoskeletal: Negative.   Skin: Negative.   Neurological: Negative.   Endo/Heme/Allergies: Negative.   Psychiatric/Behavioral:         See HPI   Individual Medical History/ Review of Systems: Changes? :No    Past medications for mental health diagnoses include: Xanax, Seroquel, BuSpar caused severe headache  Allergies: Chocolate  Current Medications:  Current Outpatient Medications:    Multiple Vitamin (MULTIVITAMIN) capsule, Take 1 capsule by mouth daily., Disp: , Rfl:     norelgestromin-ethinyl estradiol [employer] [facility]ico) 150-35 MCG/24HR transdermal patch, Xulane 150 mcg-35 mcg/24 hr transdermal patch, Disp: , Rfl:    ibuprofen (ADVIL,MOTRIN) 600 MG tablet, Take 1 tablet (600 mg total) by mouth every 6 (six) hours as needed. (Patient not taking: Reported on 10/15/2020), Disp: 30 tablet, Rfl: 0   lidocaine (LIDODERM) 5 %, Place 1 patch onto the skin daily. Remove & Discard patch within 12 hours or as directed by MD (Patient not taking: Reported on 09/06/2021), Disp: 30 patch, Rfl: 0   LORazepam (ATIVAN) 1 MG tablet, TAKE 1 TABLET BY MOUTH TWICE DAILY AS NEEDED FOR ANXIETY; MAY TAKE OCCASIONAL EXTRA 1/2 TO 1 TABLET DURING THE DAY IF NEEDED, Disp: 75 tablet, Rfl: 5   misoprostol (CYTOTEC) 200 MCG tablet, Take 3 tablets (600 mcg total) by mouth once for 1 dose., Disp: 3 tablet, Rfl: 0   naproxen (NAPROSYN) 500 MG tablet, Take 1 tablet (500 mg total) by mouth 2 (two) times daily. (Patient not taking: Reported on 09/06/2021), Disp: 30 tablet, Rfl: 0   OLANZapine (ZYPREXA) 10 MG tablet, Take 1 tablet (10 mg total) by mouth at bedtime., Disp: 30 tablet, Rfl: 5   oxyCODONE-acetaminophen (PERCOCET/ROXICET) 5-325 MG tablet, Take 1-2 tablets by mouth every 6 (six) hours as needed for severe pain. (Patient not taking: Reported on 09/06/2021), Disp: 15 tablet, Rfl: 0   traMADol (ULTRAM) 50 MG tablet, Take 1 tablet (50 mg total) by mouth every 6 (six) hours as needed. (Patient not taking: Reported on 02/21/2018), Disp: 6 tablet, Rfl: 0 Medication Side Effects: none  Family Medical/ Social History: Changes? 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  MENTAL HEALTH EXAM:  There were no vitals taken for this visit.There is no height or weight on file to calculate BMI.  General Appearance: Casual and Well Groomed  Eye Contact:  Good  Speech:  Clear and Coherent and Normal Rate  Volume:  Normal  Mood:  Euthymic  Affect:  Congruent  Thought Process:  Goal Directed and  Descriptions of Associations: Intact  Orientation:  Full (Time, Place, and Person)  Thought Content: Logical   Suicidal Thoughts:  No  Homicidal Thoughts:  No  Memory:  WNL  Judgement:  Good  Insight:  Good  Psychomotor Activity:  Normal  Concentration:  Concentration: Good and Attention Span: Good  Recall:  Good  Fund of Knowledge: Good  Language: Good  Assets:  Communication Skills Desire for Improvement Financial Resources/Insurance Housing Resilience Transportation Vocational/Educational  ADL's:  Intact  Cognition: WNL  Prognosis:  Good   DIAGNOSES:    ICD-10-CM   1. Bipolar I disorder (HCC)  F31.9 CBC with Differential/Platelet    Comprehensive metabolic panel    Lipid panel    Hemoglobin A1c    2. Generalized anxiety disorder  F41.1     3. Insomnia due to other mental disorder  F51.05    F99     4. Encounter for long-term (current) use of medications  Z79.899 CBC with Differential/Platelet    Comprehensive metabolic panel    Lipid panel    Hemoglobin A1c     Receiving Psychotherapy: Yes     RECOMMENDATIONS:  PDMP reviewed.  Ativan filled 07/20/2022. I provided 35 minutes of face to face time during this encounter, including time spent before and after the visit in records review, medical decision making, counseling pertinent to today's visit, and charting.   I am glad to see her doing so well.  No changes in treatment are necessary.  Continue Ativan 1 mg, 1 po bid prn, w/ occas 1/2 during day prn. Continue Zyprexa 10 mg, 1 p.o. nightly. FMLA for 6 days/month intermittent.  Continue to work from home 3 days per week prn, in office 2 days a wk. Up to 2 additional breaks/day.  Continue counseling. Labs ordered as above.  Stressed importance of having them drawn. Return in 6 months.  Melony Overly, PA-C

## 2023-01-02 DIAGNOSIS — J069 Acute upper respiratory infection, unspecified: Secondary | ICD-10-CM | POA: Diagnosis not present

## 2023-01-14 DIAGNOSIS — N898 Other specified noninflammatory disorders of vagina: Secondary | ICD-10-CM | POA: Diagnosis not present

## 2023-01-30 DIAGNOSIS — N898 Other specified noninflammatory disorders of vagina: Secondary | ICD-10-CM | POA: Diagnosis not present

## 2023-01-30 DIAGNOSIS — D509 Iron deficiency anemia, unspecified: Secondary | ICD-10-CM | POA: Diagnosis not present

## 2023-01-30 DIAGNOSIS — Z13220 Encounter for screening for lipoid disorders: Secondary | ICD-10-CM | POA: Diagnosis not present

## 2023-01-30 DIAGNOSIS — Z0000 Encounter for general adult medical examination without abnormal findings: Secondary | ICD-10-CM | POA: Diagnosis not present

## 2023-01-30 DIAGNOSIS — E559 Vitamin D deficiency, unspecified: Secondary | ICD-10-CM | POA: Diagnosis not present

## 2023-04-06 DIAGNOSIS — Z113 Encounter for screening for infections with a predominantly sexual mode of transmission: Secondary | ICD-10-CM | POA: Diagnosis not present

## 2023-04-06 DIAGNOSIS — N898 Other specified noninflammatory disorders of vagina: Secondary | ICD-10-CM | POA: Diagnosis not present

## 2023-04-06 DIAGNOSIS — Z1151 Encounter for screening for human papillomavirus (HPV): Secondary | ICD-10-CM | POA: Diagnosis not present

## 2023-04-06 DIAGNOSIS — Z202 Contact with and (suspected) exposure to infections with a predominantly sexual mode of transmission: Secondary | ICD-10-CM | POA: Diagnosis not present

## 2023-04-06 DIAGNOSIS — Z124 Encounter for screening for malignant neoplasm of cervix: Secondary | ICD-10-CM | POA: Diagnosis not present

## 2023-04-06 DIAGNOSIS — N760 Acute vaginitis: Secondary | ICD-10-CM | POA: Diagnosis not present

## 2023-04-06 DIAGNOSIS — Z01411 Encounter for gynecological examination (general) (routine) with abnormal findings: Secondary | ICD-10-CM | POA: Diagnosis not present

## 2023-04-27 ENCOUNTER — Encounter: Payer: Self-pay | Admitting: Physician Assistant

## 2023-04-27 ENCOUNTER — Ambulatory Visit (INDEPENDENT_AMBULATORY_CARE_PROVIDER_SITE_OTHER): Payer: BC Managed Care – PPO | Admitting: Physician Assistant

## 2023-04-27 DIAGNOSIS — F319 Bipolar disorder, unspecified: Secondary | ICD-10-CM

## 2023-04-27 DIAGNOSIS — F411 Generalized anxiety disorder: Secondary | ICD-10-CM | POA: Diagnosis not present

## 2023-04-27 MED ORDER — LORAZEPAM 1 MG PO TABS: ORAL_TABLET | ORAL | 5 refills | Status: AC

## 2023-04-27 MED ORDER — OLANZAPINE 5 MG PO TABS: 5.0000 mg | ORAL_TABLET | Freq: Every day | ORAL | 0 refills | Status: DC

## 2023-04-27 NOTE — Progress Notes (Signed)
 [facility]  Patient ID: Alison Gomez,  MRN: [id number]  PCP: Suzzette Eth, MD (Inactive)  Date of Evaluation: 04/27/2023 Time spent:20 minutes  Chief Complaint:  Chief Complaint   Anxiety; Depression; Follow-up    HISTORY/CURRENT STATUS: HPI For routine 6 month med check.  Patient is able to enjoy things.  Energy and motivation are good.  Work is going well.  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  No extreme sadness, tearfulness, or feelings of hopelessness.  Sleeps well most of the time. ADLs and personal hygiene are normal.   Denies any changes in concentration, making decisions, or remembering things.  Appetite has not changed.  Weight is stable.  Gets overwhelmed easily but not having PA like she has in the past.  Denies suicidal or homicidal thoughts.  Groggy from Zyprexa  every morning. Has been going on for awhile. Some days are worse than others. No other SE. Feels like meds are working well.   Patient denies increased energy with decreased need for sleep, increased talkativeness, racing thoughts, impulsivity or risky behaviors, increased spending, increased libido, grandiosity, increased irritability or anger, paranoia, or hallucinations.  Denies dizziness, syncope, seizures, numbness, tingling, tremor, tics, unsteady gait, slurred speech, confusion. Denies muscle or joint pain, stiffness, or dystonia. Denies unexplained weight loss, frequent infections, or sores that heal slowly.  No polyphagia, polydipsia, or polyuria. Denies visual changes or paresthesias.   Individual Medical History/ Review of Systems: Changes? :No    Past medications for mental health diagnoses include: Xanax, Seroquel , BuSpar  caused severe headache  Allergies: Chocolate  Current Medications:  Current Outpatient Medications:    Multiple Vitamin (MULTIVITAMIN) capsule, Take 1 capsule by mouth daily., Disp: , Rfl:    norelgestromin-ethinyl estradiol  (XULANE) 150-35 MCG/24HR transdermal patch, Xulane 150 mcg-35 mcg/24 hr transdermal patch, Disp: , Rfl:    OLANZapine  (ZYPREXA ) 5 MG tablet, Take 1 tablet (5 mg total) by mouth at bedtime., Disp: 30 tablet, Rfl: 0   ibuprofen  (ADVIL ,MOTRIN ) 600 MG tablet, Take 1 tablet (600 mg total) by mouth every 6 (six) hours as needed. (Patient not taking: Reported on 04/27/2023), Disp: 30 tablet, Rfl: 0   lidocaine  (LIDODERM ) 5 %, Place 1 patch onto the skin daily. Remove & Discard patch within 12 hours or as directed by MD (Patient not taking: Reported on 04/27/2023), Disp: 30 patch, Rfl: 0   LORazepam  (ATIVAN ) 1 MG tablet, TAKE 1 TABLET BY MOUTH TWICE DAILY AS NEEDED FOR ANXIETY; MAY TAKE OCCASIONAL EXTRA 1/2 TO 1 TABLET DURING THE DAY IF NEEDED, Disp: 75 tablet, Rfl: 5   misoprostol  (CYTOTEC ) 200 MCG tablet, Take 3 tablets (600 mcg total) by mouth once for 1 dose., Disp: 3 tablet, Rfl: 0   naproxen  (NAPROSYN ) 500 MG tablet, Take 1 tablet (500 mg total) by mouth 2 (two) times daily. (Patient not taking: Reported on 04/27/2023), Disp: 30 tablet, Rfl: 0   oxyCODONE -acetaminophen  (PERCOCET/ROXICET) 5-325 MG tablet, Take 1-2 tablets by mouth every 6 (six) hours as needed for severe pain. (Patient not taking: Reported on 04/27/2023), Disp: 15 tablet, Rfl: 0   traMADol  (ULTRAM ) 50 MG tablet, Take 1 tablet (50 mg total) by mouth every 6 (six) hours as needed. (Patient not taking: Reported on 04/27/2023), Disp: 6 tablet, Rfl: 0 Medication Side Effects: none  Family Medical/ Social History: Changes? 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  She works in the office every day now.  MENTAL HEALTH EXAM:  There  were no vitals taken for this visit.There is no height or weight on file to calculate BMI.  General Appearance: Casual and Well Groomed  Eye Contact:  Good  Speech:  Clear and Coherent and Normal Rate  Volume:  Normal  Mood:  Euthymic  Affect:  Congruent  Thought Process:  Goal Directed and  Descriptions of Associations: Intact  Orientation:  Full (Time, Place, and Person)  Thought Content: Logical   Suicidal Thoughts:  No  Homicidal Thoughts:  No  Memory:  WNL  Judgement:  Good  Insight:  Good  Psychomotor Activity:  Normal  Concentration:  Concentration: Good and Attention Span: Good  Recall:  Good  Fund of Knowledge: Good  Language: Good  Assets:  Communication Skills Desire for Improvement Financial Resources/Insurance Housing Physical Health Resilience Transportation Vocational/Educational  ADL's:  Intact  Cognition: WNL  Prognosis:  Good   PCP follows labs.  DIAGNOSES:    ICD-10-CM   1. Bipolar I disorder (HCC)  F31.9     2. Generalized anxiety disorder  F41.1       Receiving Psychotherapy: Yes     RECOMMENDATIONS:  PDMP reviewed.  Ativan  filled 10/27/2022. I provided 20 minutes of face to face time during this encounter, including time spent before and after the visit in records review, medical decision making, counseling pertinent to today's visit, and charting.   We discussed the grogginess.  It is affecting her morning work routine to the point that she is purposely missing doses in the evening.  I would prefer her decreasing the dose of Zyprexa  to see if it will cause as much sedation.  If it does not and is still effective then we can send in a new prescription to get her to her next appointment or we can change the dose accordingly.  Consider increasing then to 7.5 mg.  She understands and will call in approximately 3 weeks.  Continue Ativan  1 mg, 1 po bid prn, w/ occas 1/2 during day prn. Decrease Zyprexa  to 5 mg p.o. nightly. FMLA for 6 days/month intermittent.  Ok to work from home 3 days per week prn, in office 2 days a wk. Up to 2 additional breaks/day.  Continue counseling. Return in 6 months.  Marvia Slocumb, PA-C

## 2023-05-28 DIAGNOSIS — B3731 Acute candidiasis of vulva and vagina: Secondary | ICD-10-CM | POA: Diagnosis not present

## 2023-06-01 DIAGNOSIS — N898 Other specified noninflammatory disorders of vagina: Secondary | ICD-10-CM | POA: Diagnosis not present

## 2023-06-06 ENCOUNTER — Other Ambulatory Visit: Payer: Self-pay | Admitting: Physician Assistant

## 2023-08-07 DIAGNOSIS — M25572 Pain in left ankle and joints of left foot: Secondary | ICD-10-CM | POA: Diagnosis not present

## 2023-08-07 DIAGNOSIS — M79671 Pain in right foot: Secondary | ICD-10-CM | POA: Diagnosis not present

## 2023-08-07 DIAGNOSIS — G8929 Other chronic pain: Secondary | ICD-10-CM | POA: Diagnosis not present

## 2023-08-10 DIAGNOSIS — B070 Plantar wart: Secondary | ICD-10-CM | POA: Diagnosis not present

## 2023-08-28 ENCOUNTER — Ambulatory Visit (INDEPENDENT_AMBULATORY_CARE_PROVIDER_SITE_OTHER): Admitting: Podiatry

## 2023-08-28 ENCOUNTER — Encounter: Payer: Self-pay | Admitting: Podiatry

## 2023-08-28 DIAGNOSIS — M795 Residual foreign body in soft tissue: Secondary | ICD-10-CM | POA: Diagnosis not present

## 2023-08-28 NOTE — Progress Notes (Signed)
"  °  Subjective:  Patient ID: Alison Gomez, female    DOB: October 28, 1985,  MRN: [id number]  Chief Complaint  Patient presents with   Plantar Warts    Patient states possible glass or plantars wart diagnosed by PCP on right foot lateral plantar x 3 months. 8 Pain. Non diabetic. Freeze treatment done 2 weeks ago.    Discussed the use of AI scribe software for clinical note transcription with the patient, who gave verbal consent to proceed.  History of Present Illness Alison Gomez is a 38 year old female who presents with a suspected foreign body in her foot. She was referred by her primary doctor for further evaluation of a suspected foreign body in her foot.  She experiences persistent foot pain, initially thought to be a wart. She suspects glass may be embedded in her foot after breaking glass at home. An x-ray did not reveal a foreign body, possibly due to the small size of the glass. Cryotherapy was attempted without resolution. The affected area has 'holes' in the skin with no drainage or bleeding. Pain has intensified, particularly affecting her ability to have her toes done, which exacerbates discomfort. She describes the sensation as a 'little shard of glass' with a core-like feeling in the middle of the area. She has no medication allergies.      Objective:    Physical Exam General: AAO x3, NAD  Dermatological: On the plantar aspect of the foot there is a hyperkeratotic lesion.  Once I was able debride this there is 1 central area and I was able to remove a small shard of what appeared to be glass.  There is no surrounding erythema, ascending/there is no drainage or pus or any deep wound or open lesion at this time.  There is no other lesion identified.  Vascular: Dorsalis Pedis artery and Posterior Tibial artery pedal pulses are 2/4 bilateral with immedate capillary fill time.  There is no pain with calf compression, swelling, warmth, erythema.   Neruologic: Grossly intact via  light touch bilateral.   Musculoskeletal: Tenderness on the skin lesion.  No other areas of discomfort.  Gait: Unassisted, Nonantalgic.     No images are attached to the encounter.    Results RADIOLOGY Foot X-ray: No glass visible per the note.  Procedure: Debridement of foot lesion Description: Necrotic tissue was debrided from the lesion on the foot. A small shard of glass was identified and removed. The lesion had a core in the middle, indicating a possible foreign body reaction.   Assessment:   1. Residual foreign body in soft tissue      Plan:  Patient was evaluated and treated and all questions answered.  Assessment and Plan Assessment & Plan Superficial foreign body of the foot Presence of a glass shard confirmed. Ultrasound needed to check for remaining fragments. -Sharply debrided hyperkeratotic lesion.  Small fragment of glass was removed. - Order ultrasound of the foot. - Refer to [facility] on [address]. - Instruct her to contact office if not contacted by Friday. - Discussed removal options if fragments found: in-office or surgical center with sedation.  Callus of the foot Callus likely due to foreign body. Trimmed during examination. - Trim callus to assess underlying tissue. - Discuss potential medication use if no foreign body on ultrasound.   No follow-ups on file.   Alison Gomez DPM   "

## 2023-08-28 NOTE — Patient Instructions (Signed)
 I have ordered a ultrasound of the right foot. If you do not hear for them about scheduling within the next 1 week, or you have any questions please give us  a call at [phone].

## 2023-08-30 ENCOUNTER — Ambulatory Visit
Admission: RE | Admit: 2023-08-30 | Discharge: 2023-08-30 | Disposition: A | Discharge status: Home / Self Care | Source: Ambulatory Visit | Attending: Podiatry | Admitting: Podiatry

## 2023-08-30 DIAGNOSIS — R2241 Localized swelling, mass and lump, right lower limb: Secondary | ICD-10-CM | POA: Diagnosis not present

## 2023-08-30 DIAGNOSIS — M795 Residual foreign body in soft tissue: Secondary | ICD-10-CM

## 2023-09-10 ENCOUNTER — Telehealth: Payer: Self-pay | Admitting: Podiatry

## 2023-09-10 ENCOUNTER — Ambulatory Visit: Payer: Self-pay | Admitting: Podiatry

## 2023-09-10 NOTE — Telephone Encounter (Signed)
 Attempted to call patient back to go over her questions. No answer and VM not set up

## 2023-09-11 ENCOUNTER — Ambulatory Visit: Admitting: Podiatry

## 2023-10-24 ENCOUNTER — Telehealth: Payer: Self-pay | Admitting: Podiatry

## 2023-10-24 NOTE — Telephone Encounter (Signed)
 Called and left message for patient as I had received a voicemail she was wanting to set her surgery back up.

## 2023-10-25 ENCOUNTER — Encounter: Payer: Self-pay | Admitting: Podiatry

## 2023-10-25 ENCOUNTER — Ambulatory Visit (INDEPENDENT_AMBULATORY_CARE_PROVIDER_SITE_OTHER): Admitting: Podiatry

## 2023-10-25 VITALS — Ht 68.0 in | Wt 199.0 lb

## 2023-10-25 DIAGNOSIS — M7989 Other specified soft tissue disorders: Secondary | ICD-10-CM

## 2023-10-25 DIAGNOSIS — R2681 Unsteadiness on feet: Secondary | ICD-10-CM | POA: Diagnosis not present

## 2023-10-25 DIAGNOSIS — M795 Residual foreign body in soft tissue: Secondary | ICD-10-CM

## 2023-10-25 NOTE — Patient Instructions (Signed)

## 2023-10-29 ENCOUNTER — Telehealth: Payer: Self-pay | Admitting: Podiatry

## 2023-10-29 NOTE — Telephone Encounter (Signed)
 DOS- 11/09/2023  EXC BENIGN LESION 1.0 CM RT- 11421  BCBS [place] EFFECTIVE DATE- 01/02/2014  DEDUCTIBLE- $500 REMAINING- $307.33 OOP- $2000 REMAINING- $8287.66 COINSURANCE- 20%  PER AVAILITY PORTAL, NO PRIOR AUTH IS REQUIRED FOR CPT CODE 88578. DOCUMENTATION ATTACHED TO SURGERY CONSENT PACKET.

## 2023-10-29 NOTE — Progress Notes (Signed)
  Subjective:  Patient ID: Alison Gomez, female    DOB: 03-12-1985,  MRN: [id number]  Chief Complaint  Patient presents with   Foot Pain    Pt is here to discuss surgery to the right foot.     History of Present Illness Alison Gomez is a 38 year old female who presents with a suspected foreign body in her foot.  She presents today to discuss surgery as the area is still causing discomfort she like to have the area removed.  She does not report any drainage or pus or any open lesions.  No swelling or redness.    Objective:    Physical Exam General: AAO x3, NAD  Dermatological: On the plantar aspect of the foot there is a hyperkeratotic lesion.  There is tenderness palpation of this area.  There is no surrounding erythema, ascending size but there is no drainage or pus.  There is no fluctuation or crepitation.  There is no malodor.  Vascular: Dorsalis Pedis artery and Posterior Tibial artery pedal pulses are 2/4 bilateral with immedate capillary fill time.  There is no pain with calf compression, swelling, warmth, erythema.   Neruologic: Grossly intact via light touch bilateral.   Musculoskeletal: Tenderness on the skin lesion.  No other areas of discomfort.  Gait: Unassisted, Nonantalgic.     No images are attached to the encounter.       Assessment:   1. Residual foreign body in soft tissue      Plan:  Patient was evaluated and treated and all questions answered.  Assessment and Plan Assessment & Plan Superficial foreign body of the foot -Reviewed his ultrasound.  Given ongoing pain we discussed surgical excision of soft tissue mass and possible foreign body removal.  She has tried offloading as well as treatments for warts without significant improvement and given ongoing patient was at the area removed. -The incision placement as well as the postoperative course was discussed with the patient. I discussed risks of the surgery which include, but not limited  to, infection, bleeding, pain, swelling, need for further surgery, delayed or nonhealing, painful or ugly scar, numbness or sensation changes,  recurrence, transfer lesions, further deformity,  DVT/PE, loss of toe/foot. Patient understands these risks and wishes to proceed with surgery. The surgical consent was reviewed with the patient all 3 pages were signed. No promises or guarantees were given to the outcome of the procedure. All questions were answered to the best of my ability. Before the surgery the patient was encouraged to call the office if there is any further questions. The surgery will be performed at the [facility] on an outpatient basis. -Knee scooter ordered for postoperative use given she has to do walking at work and is well to stay off of this.  No follow-ups on file.  Donnice JONELLE Fees DPM

## 2023-10-29 NOTE — Telephone Encounter (Signed)
 Patient called in to schedule surgery. Scheduled for 11/09/2023 per surgery sheet and patient. Not on any GLP1 or blood thinners. Patient confirmed receipt of surgery bag and is aware [facility] will call 24-48 hours prior to surgery with arrival time. Patient's pharmacy is set in chart [facility] [address].

## 2023-10-30 ENCOUNTER — Ambulatory Visit (INDEPENDENT_AMBULATORY_CARE_PROVIDER_SITE_OTHER): Admitting: Physician Assistant

## 2023-11-09 ENCOUNTER — Other Ambulatory Visit: Payer: Self-pay | Admitting: Podiatry

## 2023-11-09 DIAGNOSIS — B070 Plantar wart: Secondary | ICD-10-CM | POA: Diagnosis not present

## 2023-11-09 DIAGNOSIS — M795 Residual foreign body in soft tissue: Secondary | ICD-10-CM | POA: Diagnosis not present

## 2023-11-09 DIAGNOSIS — D492 Neoplasm of unspecified behavior of bone, soft tissue, and skin: Secondary | ICD-10-CM | POA: Diagnosis not present

## 2023-11-09 MED ORDER — HYDROCODONE-ACETAMINOPHEN 5-325 MG PO TABS
1.0000 | ORAL_TABLET | Freq: Four times a day (QID) | ORAL | 0 refills | Status: DC | PRN
Start: 2023-11-09 — End: 2023-11-09

## 2023-11-09 MED ORDER — IBUPROFEN 800 MG PO TABS: 800.0000 mg | ORAL_TABLET | Freq: Three times a day (TID) | ORAL | 0 refills | Status: AC | PRN

## 2023-11-09 MED ORDER — CEPHALEXIN 500 MG PO CAPS: 500.0000 mg | ORAL_CAPSULE | Freq: Three times a day (TID) | ORAL | 0 refills | Status: DC

## 2023-11-09 MED ORDER — HYDROCODONE-ACETAMINOPHEN 5-325 MG PO TABS: 1.0000 | ORAL_TABLET | Freq: Four times a day (QID) | ORAL | 0 refills | Status: DC | PRN

## 2023-11-14 ENCOUNTER — Telehealth: Payer: Self-pay | Admitting: Podiatry

## 2023-11-14 NOTE — Telephone Encounter (Signed)
 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 She was unaware that three appointments had been pre-scheduled.  Friday appointments can be scheduled at 12:30 PM so she can attend during her lunch break.  All other appointments should be scheduled as late in the afternoon as possible.  Patient can be reached by phone during school breaks/lunch at the following times:  9:30-9:45 AM 12:00-1:00 PM 3:00-3:15 PM  You may make changes to her appointments if you are unable to reach her, but she prefers to speak directly before changes are made. Patient states she does not check MyChart frequently but is now aware to monitor it for any updates or changes to her appointments.

## 2023-11-15 NOTE — Telephone Encounter (Signed)
 Attempted to call patient to reschedule post ops but unable to reach or leave a message due to her mailbox being full.

## 2023-11-16 ENCOUNTER — Encounter: Payer: Self-pay | Admitting: Podiatry

## 2023-11-16 ENCOUNTER — Ambulatory Visit (INDEPENDENT_AMBULATORY_CARE_PROVIDER_SITE_OTHER): Admitting: Podiatry

## 2023-11-16 ENCOUNTER — Ambulatory Visit: Payer: Self-pay | Admitting: Podiatry

## 2023-11-16 DIAGNOSIS — M7989 Other specified soft tissue disorders: Secondary | ICD-10-CM

## 2023-11-18 NOTE — Progress Notes (Signed)
 Subjective: Chief Complaint  Patient presents with   Routine Post Op    POV #1 DOS 11/09/2023 RT EXC BENIGN LESION 1.0 CM. 0 pain. Non diabetic. Wearing surgical shoe. Using knee scooter.   38 year old female presents the office today above concerns.  She states that she is doing well and she is not trying to take the narcotic pain medication.  She does not report any fevers or chills.  She does use a knee scooter help decrease the pressure on the bottom of the foot.  Objective: AAO x3, NAD DP/PT pulses palpable bilaterally, CRT less than 3 seconds Incisions healing well to any evidence dehiscence and sutures are intact.  There is minimal edema.  There is no surrounding erythema, ascending cellulitis there is no drainage or pus or any signs of infection.  Tenderness directly along the surgical site with some localized numbness around this area as well. No pain with calf compression, swelling, warmth, erythema  Assessment: Status post soft tissue mass excision  Plan: -All treatment options discussed with the patient including all alternatives, risks, complications.  -Incisions healing well.  Dressings changed today.  Discussed that she can start to wash the foot with soap and water, dry thoroughly and apply a similar bandage. -Remain in surgical shoe.  Limited weightbearing to help facilitate healing -Ice, elevation -Pain medication if needed -Patient encouraged to call the office with any questions, concerns, change in symptoms.   Return for suture removal in 10-14 days.  Alison Gomez DPM

## 2023-11-23 ENCOUNTER — Encounter: Admitting: Podiatry

## 2023-11-27 ENCOUNTER — Ambulatory Visit: Admitting: Podiatry

## 2023-11-27 DIAGNOSIS — M7989 Other specified soft tissue disorders: Secondary | ICD-10-CM

## 2023-11-27 NOTE — Progress Notes (Unsigned)
 Patient presents for post-op visit today, DOS 11/09/2023 RT EXC BENIGN LESION 1.0 CM  I am doing okay, has been hurting the past 2-3 days. I do not know why. I have not been up more than usual. I did start rinsing with water and lightly rubbing it to clean it. Only did that twice..  RN Notes: n/a  Vital Signs: There were no vitals filed for this visit.    Radiographs: []  Taken [x]  Not taken  Surgical Site Assessment:  - Dressing:  []  Minimal dry blood, intact []  Reinforced   []  Changed     -RN Notes: no dressing in place.   - Incision:  [x]  CDI (clean, dry, intact)  []  Mild erythema  []  Drainage noted   -RN Notes: n/a  - Swelling:  []  None  [x]  Mild  []  Moderate   []  Significant     -RN Notes: n/a  - Bruising:  [x]  None  []  Present: n/a   - Sutures/Staples:  []  None [x]  Intact  [x]  Removed Today  []  Plan to remove at next visit   -Cast/Splint/Pins: [x]  None []  Intact []  Removed Today []  Plan to remove at next visit []  Replaced  -Signs of infection:  [x]  None  []  Present - Describe: n/a  -DME:    []  None []  AFW [x]  Surgical shoe []  Cast  []  Splint  -Walking status:  []  Full WB  [x]  Partial WB  []  NWB  -Utilizing device:  []  None [x]  Knee Scooter []  Crutches []  Wheelchair    DVT assessment:  [x]  Denies symptoms []  Chest pain/SOB []  Pain in calf/redness/warmth   Redressed DSD and ace wrap. Educated on signs of infection, proper dressing care, pain management, and weight bearing status. Patient will contact provider with any new or worsening symptoms. The provider assessed the patient today and reviewed instructions regarding plan of care.  - Patient seen and evaluated-sutures noted to complications.  Healing well.  No signs of infection.  Discussed that she can start a gradual transition to weight-bear as tolerated in surgical shoe and then she can transition to regular shoe as tolerated.  Continue ice and elevate. Monitor for any clinical signs or symptoms of infection  and directed to call the office immediately should any occur or go to the ER.  No follow-ups on file.  Alison Gomez DPM

## 2023-12-03 ENCOUNTER — Encounter: Admitting: Podiatry

## 2023-12-21 ENCOUNTER — Ambulatory Visit: Admitting: Podiatry

## 2023-12-21 ENCOUNTER — Encounter: Admitting: Podiatry

## 2023-12-21 DIAGNOSIS — M7989 Other specified soft tissue disorders: Secondary | ICD-10-CM

## 2023-12-21 NOTE — Progress Notes (Unsigned)
 Subjective: Chief Complaint  Patient presents with   Routine Post Op    POV #1 DOS 11/09/2023 RT EXC BENIGN LESION 1.0 CM. 0 pain. Non diabetic. Wearing surgical shoe. Using knee scooter.   38 year old female presents the office today above concerns.  She states that she is doing well and she is not trying to take the narcotic pain medication.  She does not report any fevers or chills.  She does use a knee scooter help decrease the pressure on the bottom of the foot.  Objective: AAO x3, NAD DP/PT pulses palpable bilaterally, CRT less than 3 seconds Incisions healing well to any evidence dehiscence and sutures are intact.  There is minimal edema.  There is no surrounding erythema, ascending cellulitis there is no drainage or pus or any signs of infection.  Tenderness directly along the surgical site with some localized numbness around this area as well. No pain with calf compression, swelling, warmth, erythema  Assessment: Status post soft tissue mass excision  Plan: -All treatment options discussed with the patient including all alternatives, risks, complications.  -Incisions healing well.  Dressings changed today.  Discussed that she can start to wash the foot with soap and water, dry thoroughly and apply a similar bandage. -Remain in surgical shoe.  Limited weightbearing to help facilitate healing -Ice, elevation -Pain medication if needed -Patient encouraged to call the office with any questions, concerns, change in symptoms.   Return for suture removal in 10-14 days.  Donnice JONELLE Fees DPM

## 2024-01-21 ENCOUNTER — Encounter: Admitting: Podiatry
